# Patient Record
Sex: Male | Born: 1995 | Race: Black or African American | Hispanic: No | Marital: Single | Smoking: Current every day smoker
Health system: Southern US, Community
[De-identification: ages and names within clinical notes are randomized; demographics above are authoritative.]

## PROBLEM LIST (undated history)

## (undated) DIAGNOSIS — F209 Schizophrenia, unspecified: Secondary | ICD-10-CM

---
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
## 2015-12-12 ENCOUNTER — Emergency Department
Admission: EM | Admit: 2015-12-12 | Discharge: 2015-12-12 | Disposition: A | Discharge status: Home / Self Care | Payer: Self-pay | Attending: Emergency Medicine | Admitting: Emergency Medicine

## 2015-12-12 DIAGNOSIS — F17200 Nicotine dependence, unspecified, uncomplicated: Secondary | ICD-10-CM | POA: Insufficient documentation

## 2015-12-12 DIAGNOSIS — F1290 Cannabis use, unspecified, uncomplicated: Secondary | ICD-10-CM | POA: Insufficient documentation

## 2015-12-12 DIAGNOSIS — T7840XA Allergy, unspecified, initial encounter: Secondary | ICD-10-CM

## 2015-12-12 MED ORDER — DIPHENHYDRAMINE HCL 12.5 MG/5ML PO ELIX
50.0000 mg | ORAL_SOLUTION | Freq: Once | ORAL | Status: AC
  Administered 2015-12-12: 50 mg via ORAL
  Filled 2015-12-12: qty 20

## 2015-12-12 MED ORDER — FAMOTIDINE 20 MG PO TABS
40.0000 mg | ORAL_TABLET | Freq: Once | ORAL | Status: AC
  Administered 2015-12-12: 40 mg via ORAL
  Filled 2015-12-12: qty 2

## 2015-12-12 MED ORDER — PREDNISONE 20 MG PO TABS
ORAL_TABLET | ORAL | Status: AC
  Administered 2015-12-12: 20 mg via ORAL
  Filled 2015-12-12: qty 1

## 2015-12-12 MED ORDER — PREDNISONE 20 MG PO TABS
20.0000 mg | ORAL_TABLET | Freq: Every day | ORAL | Status: DC
  Administered 2015-12-12: 20 mg via ORAL

## 2015-12-12 NOTE — Discharge Instructions (Signed)
Allergies An allergy is an abnormal reaction to a substance by the body's defense system (immune system). Allergies can develop at any age. WHAT CAUSES ALLERGIES? An allergic reaction happens when the immune system mistakenly reacts to a normally harmless substance, called an allergen, as if it were harmful. The immune system releases antibodies to fight the substance. Antibodies eventually release a chemical called histamine into the bloodstream. The release of histamine is meant to protect the body from infection, but it also causes discomfort. An allergic reaction can be triggered by:  Eating an allergen.  Inhaling an allergen.  Touching an allergen. WHAT TYPES OF ALLERGIES ARE THERE? There are many types of allergies. Common types include:  Seasonal allergies. People with this type of allergy are usually allergic to substances that are only present during certain seasons, such as molds and pollens.  Food allergies.  Drug allergies.  Insect allergies.  Animal dander allergies. WHAT ARE SYMPTOMS OF ALLERGIES? Possible allergy symptoms include:  Swelling of the lips, face, tongue, mouth, or throat.  Sneezing, coughing, or wheezing.  Nasal congestion.  Tingling in the mouth.  Rash.  Itching.  Itchy, red, swollen areas of skin (hives).  Watery eyes.  Vomiting.  Diarrhea.  Dizziness.  Lightheadedness.  Fainting.  Trouble breathing or swallowing.  Chest tightness.  Rapid heartbeat. HOW ARE ALLERGIES DIAGNOSED? Allergies are diagnosed with a medical and family history and one or more of the following:  Skin tests.  Blood tests.  A food diary. A food diary is a record of all the foods and drinks you have in a day and of all the symptoms you experience.  The results of an elimination diet. An elimination diet involves eliminating foods from your diet and then adding them back in one by one to find out if a certain food causes an allergic reaction. HOW ARE  ALLERGIES TREATED? There is no cure for allergies, but allergic reactions can be treated with medicine. Severe reactions usually need to be treated at a hospital. HOW CAN REACTIONS BE PREVENTED? The best way to prevent an allergic reaction is by avoiding the substance you are allergic to. Allergy shots and medicines can also help prevent reactions in some cases. People with severe allergic reactions may be able to prevent a life-threatening reaction called anaphylaxis with a medicine given right after exposure to the allergen.   This information is not intended to replace advice given to you by your health care provider. Make sure you discuss any questions you have with your health care provider.   Document Released: 08/16/2002 Document Revised: 06/13/2014 Document Reviewed: 03/04/2014 Elsevier Interactive Patient Education [employer]2016 [employer]  Please take 2 of the over-the-counter Benadryl pills 4 times a day today and tomorrow. Since she had the first dose here in the ER take the Benadryl only 3 more times today. Should be about 6 hours apart for each dose. He takes Zantac once a day U had a dose of a similar medicine here in the emergency room so I will give you a prescription for one dose for tomorrow. Take prednisone once a day. I will give you dose of prednisone here in the emergency room so only take 1 dose again tomorrow. Please follow up with one of the doctors on the list I gave you [facility]. Return for worsening swelling return of the rash. Call 911 if you become short of breath at all. I would go ahead and wash all the bedclothes. CT can think what  might have caused the reaction.

## 2015-12-12 NOTE — ED Notes (Signed)
Pt presents to ED with c/o lower lip swelling and rash in the groin area. Pt states he woke up this morning around 4am and felt like his lower lip was swollen. Pt states he went to the [facility] to look at his lip when he also noticed an itch in the groin area. Pt denies any urinary c/o or s/x's. Pt denies any c/o sore throat, throat swelling or difficulty breathing. Pt denies any allergies to foods or medications. Pt denies pain or burning w/ the rash, just intense itching.

## 2015-12-12 NOTE — ED Provider Notes (Signed)
[facility] Emergency Department Provider Note   ____________________________________________  Time seen: Approximately 7:39 AM  I have reviewed the triage vital signs and the nursing notes.   HISTORY  Chief Complaint Rash and Oral Swelling    HPI Nicholas Barron is a 20 y.o. male who woke up this morning with a swollen lower lip and hives diffusely. Deep some alcohol on his arms is gone away lower lip swelling does not he was itchy but is not anymore. He has not used any new detergents or foods. Else. He is not sure what he could be allergic to as he has not had this problem before. He was not short of breath at all never had any wheezing.   History reviewed. No pertinent past medical history.  There are no active problems to display for this patient.   History reviewed. No pertinent past surgical history.  No current outpatient prescriptions on file.  Allergies Review of patient's allergies indicates no known allergies.  No family history on file.  Social History Social History  Substance Use Topics  . Smoking status: Current Every Day Smoker  . Smokeless tobacco: None  . Alcohol Use: No    Review of Systems Constitutional: No fever/chills Eyes: No visual changes. ENT: No sore throat. Cardiovascular: Denies chest pain. Respiratory: Denies shortness of breath. Gastrointestinal: No abdominal pain.  No nausea, no vomiting.  No diarrhea.  No constipation. Genitourinary: Negative for dysuria. Musculoskeletal: Negative for back pain. Skin: Negative for rash. Neurological: Negative for headaches, focal weakness or numbness. { 10-point ROS otherwise negative.  ____________________________________________   PHYSICAL EXAM:  VITAL SIGNS: ED Triage Vitals  Enc Vitals Group     BP 12/12/15 0625 128/74 mmHg     Pulse Rate 12/12/15 0625 73     Resp 12/12/15 0625 16     Temp 12/12/15 0627 97.7 F (36.5 C)     Temp Source 12/12/15 0627  Oral     SpO2 12/12/15 0625 99 %     Weight 12/12/15 0625 235 lb (106.595 kg)     Height 12/12/15 0625 5\' 7"  (1.702 m)     Head Cir --      Peak Flow --      Pain Score 12/12/15 0621 0     Pain Loc --      Pain Edu? --      Excl. in GC? --     Constitutional: Alert and oriented. Well appearing and in no acute distress. Eyes: Conjunctivae are normal. PERRL. EOMI. Head: Atraumatic.Lower lip is still somewhat swollen Nose: No congestion/rhinnorhea. Mouth/Throat: Mucous membranes are moist.  Oropharynx non-erythematous. Neck: No stridor.  Cardiovascular: Normal rate, regular rhythm. Grossly normal heart sounds.  Good peripheral circulation. Respiratory: Normal respiratory effort.  No retractions. Lungs CTAB. Gastrointestinal: Soft and nontender. No distention. No abdominal bruits. No CVA tenderness. Musculoskeletal: No lower extremity tenderness nor edema.  No joint effusions. Neurologic:  Normal speech and language. No gross focal neurologic deficits are appreciated. No gait instability. Skin:  Skin is warm, dry and intact. No rash noted. Psychiatric: Mood and affect are normal. Speech and behavior are normal.  ____________________________________________   LABS (all labs ordered are listed, but only abnormal results are displayed)  Labs Reviewed - No data to display ____________________________________________  EKG   ____________________________________________  RADIOLOGY      ____________________________________________   PROCEDURES    Procedures  She has no return of the rash she has no trouble with breathing still.  His swelling really hasn't changed much wider gone down just a little bit I will let him go with follow-up.  ____________________________________________   INITIAL IMPRESSION / ASSESSMENT AND PLAN / ED COURSE  Pertinent labs & imaging results that were available during my care of the patient were reviewed by me and considered in my medical  decision making (see chart for details).  ____________________________________________   FINAL CLINICAL IMPRESSION(S) / ED DIAGNOSES  Final diagnoses:  Allergic reaction, initial encounter      NEW MEDICATIONS STARTED DURING THIS VISIT:  There are no discharge medications for this patient.    Note:  This document was prepared using Dragon voice recognition software and may include unintentional dictation errors.    Arnaldo Natal, MD 12/12/15 [phone]

## 2015-12-12 NOTE — ED Notes (Signed)
Patient presents to the ED with c/o hives to his body, lower lip swelling, and genital swelling. Patient reports that he woke up this morning with the aforementioned symptoms. (+) hives observed in triage to the groin. Patient has no respiratory compromise noted; normal WOB noted; able to maintain a patent airway with no appreciable lingual edema; able to handle oral secretions. Denies new foods, soaps, medications, cosmetics, detergents, etc.

## 2016-06-03 ENCOUNTER — Emergency Department
Admission: EM | Admit: 2016-06-03 | Discharge: 2016-06-03 | Disposition: A | Discharge status: Home / Self Care | Payer: Self-pay | Attending: Emergency Medicine | Admitting: Emergency Medicine

## 2016-06-03 ENCOUNTER — Encounter: Payer: Self-pay | Admitting: Emergency Medicine

## 2016-06-03 ENCOUNTER — Emergency Department: Payer: Self-pay

## 2016-06-03 DIAGNOSIS — F17200 Nicotine dependence, unspecified, uncomplicated: Secondary | ICD-10-CM | POA: Insufficient documentation

## 2016-06-03 DIAGNOSIS — Y939 Activity, unspecified: Secondary | ICD-10-CM | POA: Insufficient documentation

## 2016-06-03 DIAGNOSIS — S6731XA Crushing injury of right wrist, initial encounter: Secondary | ICD-10-CM | POA: Insufficient documentation

## 2016-06-03 DIAGNOSIS — Y929 Unspecified place or not applicable: Secondary | ICD-10-CM | POA: Insufficient documentation

## 2016-06-03 DIAGNOSIS — Y999 Unspecified external cause status: Secondary | ICD-10-CM | POA: Insufficient documentation

## 2016-06-03 DIAGNOSIS — W231XXA Caught, crushed, jammed, or pinched between stationary objects, initial encounter: Secondary | ICD-10-CM | POA: Insufficient documentation

## 2016-06-03 MED ORDER — NAPROXEN 500 MG PO TABS: 500.0000 mg | ORAL_TABLET | Freq: Two times a day (BID) | ORAL | 0 refills | Status: DC

## 2016-06-03 NOTE — ED Provider Notes (Signed)
[facility] Emergency Department Provider Note ____________________________________________  Time seen: Approximately 6:06 PM  I have reviewed the triage vital signs and the nursing notes.   HISTORY  Chief Complaint Extremity Laceration and Wrist Pain    HPI Nicholas Barron is a 20 y.o. male who presents to the emergency department for evaluation of right wrist pain. He states that approximately 2 hours ago, he slammed his wrist and his car door. He has not taken anything for pain since the incident. He denies previous wrist fracture.  History reviewed. No pertinent past medical history.  There are no active problems to display for this patient.   History reviewed. No pertinent surgical history.  Prior to Admission medications   Medication Sig Start Date End Date Taking? Authorizing Provider  naproxen (NAPROSYN) 500 MG tablet Take 1 tablet (500 mg total) by mouth 2 (two) times daily with a meal. 06/03/16   Chinita Pesterari B Jameon Deller, FNP    Allergies Patient has no known allergies.  No family history on file.  Social History Social History  Substance Use Topics  . Smoking status: Current Every Day Smoker  . Smokeless tobacco: Never Used  . Alcohol use No    Review of Systems Constitutional: No recent illness. Cardiovascular: Denies chest pain or palpitations. Respiratory: Denies shortness of breath. Musculoskeletal: Pain in Right wrist Skin: Negative for rash, wound, lesion. Neurological: Negative for focal weakness or numbness.  ____________________________________________   PHYSICAL EXAM:  VITAL SIGNS: ED Triage Vitals  Enc Vitals Group     BP 06/03/16 1803 (!) 144/85     Pulse Rate 06/03/16 1803 86     Resp 06/03/16 1803 18     Temp 06/03/16 1803 98.2 F (36.8 C)     Temp Source 06/03/16 1803 Oral     SpO2 06/03/16 1803 100 %     Weight 06/03/16 1801 235 lb (106.6 kg)     Height 06/03/16 1804 5\' 8"  (1.727 m)     Head Circumference --       Peak Flow --      Pain Score 06/03/16 1755 8     Pain Loc --      Pain Edu? --      Excl. in GC? --     Constitutional: Alert and oriented. Well appearing and in no acute distress. Eyes: Conjunctivae are normal. EOMI. Head: Atraumatic. Neck: No stridor.  Respiratory: Normal respiratory effort.   Musculoskeletal: Right wrist tender to palpation along the joint line. No obvious deformity. Pain increases with flexion and extension as well as supination. Neurologic:  Normal speech and language. No gross focal neurologic deficits are appreciated. Speech is normal. No gait instability. Skin:  Skin is warm, dry and intact. Atraumatic. Psychiatric: Mood and affect are normal. Speech and behavior are normal.  ____________________________________________   LABS (all labs ordered are listed, but only abnormal results are displayed)  Labs Reviewed - No data to display ____________________________________________  RADIOLOGY  Right wrist is negative for acute bony abnormality per radiology. ____________________________________________   PROCEDURES  Procedure(s) performed: Velcro cock-up splint applied to the right wrist by the ER tech. Patient was neurovascularly intact post-application.   ____________________________________________   INITIAL IMPRESSION / ASSESSMENT AND PLAN / ED COURSE  Clinical Course     Pertinent labs & imaging results that were available during my care of the patient were reviewed by me and considered in my medical decision making (see chart for details).  20 year old male presenting to the emergency  department after his right hand was shut in his car door approximately 2 hours or to arrival. Exam is unremarkable and the x-ray does not reveal any acute abnormality. He was instructed to use the Velcro splint over the week, take the anti-inflammatory, and follow-up with the orthopedic specialist if not improving. He was advised to return to the emergency  department for symptoms that change or worsen if he is unable schedule an appointment. ____________________________________________   FINAL CLINICAL IMPRESSION(S) / ED DIAGNOSES  Final diagnoses:  Crush injury, wrist, right, initial encounter       Chinita PesterCari B Nettye Flegal, FNP 06/03/16 1928    Minna AntisKevin Paduchowski, MD 06/03/16 2226

## 2016-06-03 NOTE — ED Triage Notes (Signed)
States he jammed his right wrist in to door   Having pain to right wirst area with min swelling  Positive pulses noted

## 2016-06-03 NOTE — ED Notes (Signed)
Pt is in good condition; discharge instructions reviewed; follow up care and home care reviewed; prescription medication reviewed; pt verbalized understanding; pt is ambulatory and went home with family

## 2016-06-03 NOTE — Discharge Instructions (Signed)
Follow-up with orthopedics for symptoms that are not improving over the week. Return to the emergency department for symptoms that change or worsen if you are unable to schedule an appointment with the specialist or your primary care provider.

## 2016-06-06 ENCOUNTER — Emergency Department
Admission: EM | Admit: 2016-06-06 | Discharge: 2016-06-06 | Disposition: A | Discharge status: Home / Self Care | Payer: Medicaid Other | Attending: Emergency Medicine | Admitting: Emergency Medicine

## 2016-06-06 ENCOUNTER — Encounter: Payer: Self-pay | Admitting: Emergency Medicine

## 2016-06-06 DIAGNOSIS — T782XXA Anaphylactic shock, unspecified, initial encounter: Secondary | ICD-10-CM

## 2016-06-06 DIAGNOSIS — L509 Urticaria, unspecified: Secondary | ICD-10-CM | POA: Insufficient documentation

## 2016-06-06 DIAGNOSIS — F17200 Nicotine dependence, unspecified, uncomplicated: Secondary | ICD-10-CM | POA: Insufficient documentation

## 2016-06-06 DIAGNOSIS — Z791 Long term (current) use of non-steroidal anti-inflammatories (NSAID): Secondary | ICD-10-CM | POA: Insufficient documentation

## 2016-06-06 MED ORDER — FAMOTIDINE IN NACL 20-0.9 MG/50ML-% IV SOLN
20.0000 mg | Freq: Once | INTRAVENOUS | Status: AC
  Administered 2016-06-06: 20 mg via INTRAVENOUS
  Filled 2016-06-06: qty 50

## 2016-06-06 MED ORDER — EPINEPHRINE 0.3 MG/0.3ML IJ SOAJ: 0.3000 mg | Freq: Once | INTRAMUSCULAR | 0 refills | Status: AC

## 2016-06-06 MED ORDER — FAMOTIDINE 40 MG PO TABS: 40.0000 mg | ORAL_TABLET | Freq: Every evening | ORAL | 0 refills | Status: DC

## 2016-06-06 MED ORDER — DIPHENHYDRAMINE HCL 25 MG PO CAPS: 25.0000 mg | ORAL_CAPSULE | ORAL | 0 refills | Status: DC | PRN

## 2016-06-06 MED ORDER — PREDNISONE 20 MG PO TABS: 60.0000 mg | ORAL_TABLET | Freq: Every day | ORAL | 0 refills | Status: DC

## 2016-06-06 MED ORDER — DIPHENHYDRAMINE HCL 50 MG/ML IJ SOLN
50.0000 mg | Freq: Once | INTRAMUSCULAR | Status: AC
  Administered 2016-06-06: 50 mg via INTRAVENOUS
  Filled 2016-06-06: qty 1

## 2016-06-06 MED ORDER — EPINEPHRINE 0.3 MG/0.3ML IJ SOAJ
0.3000 mg | Freq: Once | INTRAMUSCULAR | Status: AC
  Administered 2016-06-06: 0.3 mg via INTRAMUSCULAR
  Filled 2016-06-06: qty 0.3

## 2016-06-06 MED ORDER — METHYLPREDNISOLONE SODIUM SUCC 125 MG IJ SOLR
125.0000 mg | Freq: Once | INTRAMUSCULAR | Status: AC
  Administered 2016-06-06: 125 mg via INTRAVENOUS
  Filled 2016-06-06: qty 2

## 2016-06-06 NOTE — ED Notes (Signed)
Pt with improved rash and hives noted. Pt states improved itching, resps unlabored.

## 2016-06-06 NOTE — ED Triage Notes (Signed)
Patient reports "breaking out" for the past 30 minutes.  Hives noted to arms bilaterally.  States has had this happen before, unknown allergen.

## 2016-06-06 NOTE — ED Notes (Signed)
Pt ambulatory with steady gait; c/o rash "coming and going for a while"; talking in complete sentences; no respiratory difficulty

## 2016-06-06 NOTE — ED Notes (Signed)
Pt with hives noted to bilateral forearms, abd, chest wall, back, legs. Pt states hives extend to his groin. Pt with hives noted around left lateral eyebrow. No angioedema noted. Pt with unlabored resps. Pt denies shob. No vomiting, nausea per pt. Pt states hives have improved after iv benadryl received in triage. Pt placed on cont pox and blood pressure cuff. Call bell at right side.

## 2016-06-06 NOTE — ED Notes (Signed)
Pt with improved swelling noted to left lateral eyebrow, hives to arms and legs, abd, groin unchanged.

## 2016-06-06 NOTE — ED Provider Notes (Signed)
[facility] Emergency Department Provider Note   ____________________________________________   First MD Initiated Contact with Patient 06/06/16 [phone]     (approximate)  I have reviewed the triage vital signs and the nursing notes.   HISTORY  Chief Complaint Urticaria    HPI Nicholas Barron is a 21 y.o. male who comes into the hospital today with hives. The patient reports it started around 2:30. He was watching TV when he just started feeling like his throat was swelling and he was itching with hives around his neck his arms and his back. The patient also had some swelling in his genital area. The patient had some shortness of breath but he did have chest tightness. He had spaghetti earlier today but had no new foods or drinks. The patient was given some medicine in triage and he reports that the shortness of breath is improved a little bit that he still feels a swelling in his throat. The patient still does have some hives but he came into the hospital for evaluation. He's never had anything like this happen in the past.    History reviewed. No pertinent past medical history.  There are no active problems to display for this patient.   History reviewed. No pertinent surgical history.  Prior to Admission medications   Medication Sig Start Date End Date Taking? Authorizing Provider  diphenhydrAMINE (BENADRYL) 25 mg capsule Take 1 capsule (25 mg total) by mouth every 4 (four) hours as needed. 06/06/16 06/06/17  Rebecka ApleyAllison P Webster, MD  EPINEPHrine 0.3 mg/0.3 mL IJ SOAJ injection Inject 0.3 mLs (0.3 mg total) into the muscle once. 06/06/16 06/06/16  Rebecka ApleyAllison P Webster, MD  famotidine (PEPCID) 40 MG tablet Take 1 tablet (40 mg total) by mouth every evening. 06/06/16 06/06/17  Rebecka ApleyAllison P Webster, MD  naproxen (NAPROSYN) 500 MG tablet Take 1 tablet (500 mg total) by mouth 2 (two) times daily with a meal. 06/03/16   Cari B Triplett, FNP  predniSONE (DELTASONE) 20 MG tablet Take 3  tablets (60 mg total) by mouth daily. 06/06/16   Rebecka ApleyAllison P Webster, MD    Allergies Patient has no known allergies.  No family history on file.  Social History Social History  Substance Use Topics  . Smoking status: Current Every Day Smoker  . Smokeless tobacco: Never Used  . Alcohol use No    Review of Systems Constitutional: No fever/chills Eyes: No visual changes. ENT: Throat swelling Cardiovascular: Denies chest pain. Respiratory:  shortness of breath. Gastrointestinal: No abdominal pain.  No nausea, no vomiting.  No diarrhea.  No constipation. Genitourinary: Negative for dysuria. Musculoskeletal: Negative for back pain. Skin: Hives Neurological: Negative for headaches, focal weakness or numbness.  10-point ROS otherwise negative.  ____________________________________________   PHYSICAL EXAM:  VITAL SIGNS: ED Triage Vitals [06/06/16 0252]  Enc Vitals Group     BP 133/73     Pulse Rate 91     Resp 18     Temp 97.6 F (36.4 C)     Temp Source Oral     SpO2 94 %     Weight      Height      Head Circumference      Peak Flow      Pain Score      Pain Loc      Pain Edu?      Excl. in GC?     Constitutional: Alert and oriented. Well appearing and in Mild distress. Eyes: Conjunctivae are normal.  PERRL. EOMI. Head: Atraumatic. Nose: No congestion/rhinnorhea. Mouth/Throat: Mucous membranes are moist.  Oropharynx non-erythematous. Cardiovascular: Normal rate, regular rhythm. Grossly normal heart sounds.  Good peripheral circulation. Respiratory: Normal respiratory effort.  No retractions. Lungs CTAB. Gastrointestinal: Soft and nontender. No distention. Positive bowel sounds Musculoskeletal: No lower extremity tenderness nor edema.   Neurologic:  Normal speech and language.  Skin:  Skin is warm, dry and intact. Hives around the patient's neck and on his bilateral arms no significant swelling to posterior oropharynx. Psychiatric: Mood and affect are normal.    ____________________________________________   LABS (all labs ordered are listed, but only abnormal results are displayed)  Labs Reviewed - No data to display ____________________________________________  EKG  none ____________________________________________  RADIOLOGY  none ____________________________________________   PROCEDURES  Procedure(s) performed: None  Procedures  Critical Care performed: No  ____________________________________________   INITIAL IMPRESSION / ASSESSMENT AND PLAN / ED COURSE  Pertinent labs & imaging results that were available during my care of the patient were reviewed by me and considered in my medical decision making (see chart for details).  This is a 21 year old male who comes into the hospital today with some hives, chest tightness and shortness of breath. The patient initially received some Solu-Medrol and Benadryl but I also gave him an epinephrine dose and Pepcid. After the epinephrine the patient's hives improved significantly and the chest tightness improved. I'm unsure what the patient is allergic to but I told him he does need to follow-up with the acute care clinic for further evaluation. The patient will go home with some prescriptions for prednisone Pepcid Benadryl and an EpiPen. The patient be discharged home.  Clinical Course      ____________________________________________   FINAL CLINICAL IMPRESSION(S) / ED DIAGNOSES  Final diagnoses:  Hives  Anaphylaxis, initial encounter      NEW MEDICATIONS STARTED DURING THIS VISIT:  New Prescriptions   DIPHENHYDRAMINE (BENADRYL) 25 MG CAPSULE    Take 1 capsule (25 mg total) by mouth every 4 (four) hours as needed.   EPINEPHRINE 0.3 MG/0.3 ML IJ SOAJ INJECTION    Inject 0.3 mLs (0.3 mg total) into the muscle once.   FAMOTIDINE (PEPCID) 40 MG TABLET    Take 1 tablet (40 mg total) by mouth every evening.   PREDNISONE (DELTASONE) 20 MG TABLET    Take 3 tablets (60 mg  total) by mouth daily.     Note:  This document was prepared using Dragon voice recognition software and may include unintentional dictation errors.    Rebecka Apley, MD 06/06/16 [phone]

## 2018-10-27 ENCOUNTER — Emergency Department: Payer: Self-pay

## 2018-10-27 ENCOUNTER — Other Ambulatory Visit: Payer: Self-pay

## 2018-10-27 ENCOUNTER — Emergency Department
Admission: EM | Admit: 2018-10-27 | Discharge: 2018-10-27 | Disposition: A | Discharge status: Home / Self Care | Payer: Self-pay | Attending: Emergency Medicine | Admitting: Emergency Medicine

## 2018-10-27 ENCOUNTER — Encounter: Payer: Self-pay | Admitting: Emergency Medicine

## 2018-10-27 DIAGNOSIS — F17210 Nicotine dependence, cigarettes, uncomplicated: Secondary | ICD-10-CM | POA: Insufficient documentation

## 2018-10-27 DIAGNOSIS — M79622 Pain in left upper arm: Secondary | ICD-10-CM | POA: Insufficient documentation

## 2018-10-27 DIAGNOSIS — M79602 Pain in left arm: Secondary | ICD-10-CM

## 2018-10-27 HISTORY — DX: Schizophrenia, unspecified: F20.9

## 2018-10-27 MED ORDER — NAPROXEN 500 MG PO TABS
500.0000 mg | ORAL_TABLET | Freq: Once | ORAL | Status: AC
  Administered 2018-10-27: 500 mg via ORAL
  Filled 2018-10-27: qty 1

## 2018-10-27 NOTE — Discharge Instructions (Signed)
Your exam and XR are normal at this time. Take OTC ibuprofen or naproxen for pain relief. Follow-up with one of the local community clinics for ongoing symptoms.

## 2018-10-27 NOTE — ED Triage Notes (Signed)
Pt to ED with c/o of left arm pain that he states has been "going on for a while". Pt not sure if it's been or week or longer. Pt can confirm or deny injury.

## 2018-10-29 NOTE — ED Provider Notes (Signed)
[facility] Emergency Department Provider Note ____________________________________________  Time seen: 1930  I have reviewed the triage vital signs and the nursing notes.  HISTORY  Chief Complaint  Arm Pain   HPI Nicholas Barron is a 23 y.o. male presents himself to the ED for evaluation of intermittent left arm pain. He localizes the discomfort to the deltoid and upper arm region. He denies any recent injury, trauma, paresthesias, weakness, or grip changes. His is a poor historian, in terms of offering any useful information on what the pain feels like, alleviating/aggrevating factors, or timing or onset.   Past Medical History:  Diagnosis Date  . Schizophrenia (HCC)     There are no active problems to display for this patient.   History reviewed. No pertinent surgical history.  Prior to Admission medications   Medication Sig Start Date End Date Taking? Authorizing Provider  diphenhydrAMINE (BENADRYL) 25 mg capsule Take 1 capsule (25 mg total) by mouth every 4 (four) hours as needed. 06/06/16 06/06/17  Rebecka Apley, MD  famotidine (PEPCID) 40 MG tablet Take 1 tablet (40 mg total) by mouth every evening. 06/06/16 06/06/17  Rebecka Apley, MD    Allergies Patient has no known allergies.  History reviewed. No pertinent family history.  Social History Social History   Tobacco Use  . Smoking status: Current Every Day Smoker  . Smokeless tobacco: Never Used  Substance Use Topics  . Alcohol use: No  . Drug use: Yes    Types: Marijuana    Review of Systems  Constitutional: Negative for fever. Eyes: Negative for visual changes. ENT: Negative for sore throat. Cardiovascular: Negative for chest pain. Respiratory: Negative for shortness of breath. Gastrointestinal: Negative for abdominal pain, vomiting and diarrhea. Genitourinary: Negative for dysuria. Musculoskeletal: Negative for back pain. Skin: Negative for rash. Neurological: Negative for  headaches, focal weakness or numbness. ____________________________________________  PHYSICAL EXAM:  VITAL SIGNS: ED Triage Vitals  Enc Vitals Group     BP 10/27/18 1857 (!) 162/92     Pulse Rate 10/27/18 1857 92     Resp 10/27/18 1857 18     Temp 10/27/18 1857 98.7 F (37.1 C)     Temp Source 10/27/18 1857 Oral     SpO2 10/27/18 1857 100 %     Weight 10/27/18 1857 200 lb (90.7 kg)     Height 10/27/18 1857 5\' 6"  (1.676 m)     Head Circumference --      Peak Flow --      Pain Score 10/27/18 1901 5     Pain Loc --      Pain Edu? --      Excl. in GC? --     Constitutional: Alert and oriented. Well appearing and in no distress. Head: Normocephalic and atraumatic. Eyes: Conjunctivae are normal. Normal extraocular movements Neck: Supple. No thyromegaly. Cardiovascular: Normal rate, regular rhythm. Normal distal pulses. Respiratory: Normal respiratory effort. No wheezes/rales/rhonchi. Musculoskeletal: Left shoulder without deformity, dislocation, or sulcus sign. Normal shoulder ROM without crepitus. Normal rotator cuff resistance testing. Normal grip strength.  Nontender with normal range of motion in all other extremities.  Neurologic: CN II-XII grossly intact. Normal intrinsic and opposition testing. Normal gait without ataxia. Normal speech and language. No gross focal neurologic deficits are appreciated. Skin:  Skin is warm, dry and intact. No rash noted. Psychiatric: Mood is anxious and affect is flat.  ____________________________________________  RADIOLOGY  Left Shoulder  Negative ____________________________________________ PROCEDURES  Procedures Naproxen 500 mg PO ____________________________________________  INITIAL IMPRESSION / ASSESSMENT AND PLAN / ED COURSE  [name] was evaluated in Emergency Department on 10/30/2018 for the symptoms described in the history of present illness. He was evaluated in the context of the global COVID-19 pandemic, which  necessitated consideration that the patient might be at risk for infection with the SARS-CoV-2 virus that causes COVID-19. Institutional protocols and algorithms that pertain to the evaluation of patients at risk for COVID-19 are in a state of rapid change based on information released by regulatory bodies including the CDC and federal and state organizations. These policies and algorithms were followed during the patient's care in the ED.  Patient with ED evaluation of intermittent left arm muscle pain. His exam is reassuring as he has not neuromuscular deficits, no indication of internal derangement of the shoulder, or mechanical dysfunction. He has a negative XR and benign exam. He is discharged to see his provider for ongoing symptoms. He should take OTC meds for pain and inflammation as needed. Return precautions are reviewed.  ____________________________________________  FINAL CLINICAL IMPRESSION(S) / ED DIAGNOSES  Final diagnoses:  Left arm pain      Helayna Dun, Charlesetta IvoryJenise V Bacon, PA-C 10/30/18 1220    Jene EveryKinner, Robert, MD 10/31/18 1743

## 2018-12-04 ENCOUNTER — Other Ambulatory Visit: Payer: Self-pay

## 2018-12-04 ENCOUNTER — Encounter: Payer: Self-pay | Admitting: Emergency Medicine

## 2018-12-04 ENCOUNTER — Emergency Department
Admission: EM | Admit: 2018-12-04 | Discharge: 2018-12-05 | Disposition: A | Discharge status: Other Acute Inpatient Hospital | Payer: Medicaid Other | Attending: Emergency Medicine | Admitting: Emergency Medicine

## 2018-12-04 DIAGNOSIS — F17200 Nicotine dependence, unspecified, uncomplicated: Secondary | ICD-10-CM | POA: Insufficient documentation

## 2018-12-04 DIAGNOSIS — F209 Schizophrenia, unspecified: Secondary | ICD-10-CM | POA: Insufficient documentation

## 2018-12-04 DIAGNOSIS — R4689 Other symptoms and signs involving appearance and behavior: Secondary | ICD-10-CM | POA: Insufficient documentation

## 2018-12-04 DIAGNOSIS — Z20828 Contact with and (suspected) exposure to other viral communicable diseases: Secondary | ICD-10-CM | POA: Insufficient documentation

## 2018-12-04 LAB — CBC
HCT: 44.9 % (ref 39.0–52.0)
Hemoglobin: 14.8 g/dL (ref 13.0–17.0)
MCH: 29.4 pg (ref 26.0–34.0)
MCHC: 33 g/dL (ref 30.0–36.0)
MCV: 89.3 fL (ref 80.0–100.0)
Platelets: 275 10*3/uL (ref 150–400)
RBC: 5.03 MIL/uL (ref 4.22–5.81)
RDW: 15 % (ref 11.5–15.5)
WBC: 11.8 10*3/uL — ABNORMAL HIGH (ref 4.0–10.5)
nRBC: 0 % (ref 0.0–0.2)

## 2018-12-04 LAB — URINE DRUG SCREEN, QUALITATIVE (ARMC ONLY)
Amphetamines, Ur Screen: NOT DETECTED
Barbiturates, Ur Screen: NOT DETECTED
Benzodiazepine, Ur Scrn: NOT DETECTED
Cannabinoid 50 Ng, Ur ~~LOC~~: POSITIVE — AB
Cocaine Metabolite,Ur ~~LOC~~: NOT DETECTED
MDMA (Ecstasy)Ur Screen: NOT DETECTED
Methadone Scn, Ur: NOT DETECTED
Opiate, Ur Screen: NOT DETECTED
Phencyclidine (PCP) Ur S: NOT DETECTED
Tricyclic, Ur Screen: NOT DETECTED

## 2018-12-04 LAB — COMPREHENSIVE METABOLIC PANEL
ALT: 11 U/L (ref 0–44)
AST: 18 U/L (ref 15–41)
Albumin: 4.1 g/dL (ref 3.5–5.0)
Alkaline Phosphatase: 40 U/L (ref 38–126)
Anion gap: 11 (ref 5–15)
BUN: 18 mg/dL (ref 6–20)
CO2: 22 mmol/L (ref 22–32)
Calcium: 8.5 mg/dL — ABNORMAL LOW (ref 8.9–10.3)
Chloride: 107 mmol/L (ref 98–111)
Creatinine, Ser: 0.96 mg/dL (ref 0.61–1.24)
GFR calc Af Amer: >60 mL/min (ref >60)
GFR calc non Af Amer: >60 mL/min (ref >60)
Glucose, Bld: 85 mg/dL (ref 70–99)
Potassium: 3.6 mmol/L (ref 3.5–5.1)
Sodium: 140 mmol/L (ref 135–145)
Total Bilirubin: 1 mg/dL (ref 0.3–1.2)
Total Protein: 6.8 g/dL (ref 6.5–8.1)

## 2018-12-04 LAB — SALICYLATE LEVEL: Salicylate Lvl: <7 mg/dL (ref 2.8–30.0)

## 2018-12-04 LAB — ACETAMINOPHEN LEVEL: Acetaminophen (Tylenol), Serum: <10 ug/mL — ABNORMAL LOW (ref 10–30)

## 2018-12-04 LAB — ETHANOL: Alcohol, Ethyl (B): <10 mg/dL (ref <10)

## 2018-12-04 MED ORDER — HALOPERIDOL LACTATE 5 MG/ML IJ SOLN
5.0000 mg | Freq: Once | INTRAMUSCULAR | Status: AC
  Administered 2018-12-04: 5 mg via INTRAMUSCULAR
  Filled 2018-12-04: qty 1

## 2018-12-04 MED ORDER — LORAZEPAM 2 MG/ML IJ SOLN
2.0000 mg | Freq: Once | INTRAMUSCULAR | Status: AC
  Administered 2018-12-04: 2 mg via INTRAMUSCULAR
  Filled 2018-12-04: qty 1

## 2018-12-04 MED ORDER — NICOTINE 14 MG/24HR TD PT24: 14.0000 mg | MEDICATED_PATCH | Freq: Once | TRANSDERMAL | Status: DC

## 2018-12-04 MED ORDER — DIPHENHYDRAMINE HCL 50 MG/ML IJ SOLN
50.0000 mg | Freq: Once | INTRAMUSCULAR | Status: AC
Start: 2018-12-04 — End: 2018-12-04
  Administered 2018-12-04: 23:00:00 50 mg via INTRAMUSCULAR
  Filled 2018-12-04: qty 1
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
## 2018-12-04 NOTE — ED Notes (Signed)
Pt dressed out. Pt belonging bag:   1 cell phone  2 bank cards 1 black boxers/briefs 1 pair black socks 1 pair white shoes 1 black hat 1 grey sweat pants 2 earrings silver colored 1 black shirt

## 2018-12-04 NOTE — ED Notes (Signed)
Pt given sandwich tray and drink. 
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
## 2018-12-04 NOTE — ED Notes (Signed)
IVC PENDING  TTS  CONSULT  FROM  [facility]

## 2018-12-04 NOTE — ED Notes (Signed)
Report to include Situation, Background, Assessment, and Recommendations received from [facility]. Patient alert and oriented, warm and dry, in no acute distress. Patient denies SI, HI, AVH and pain. Patient made aware of Q15 minute rounds and Engineer, drilling presence for their safety. Patient instructed to come to me with needs or concerns.
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
## 2018-12-04 NOTE — ED Notes (Signed)
Patient assigned to appropriate care area   Introduced self to pt  Patient oriented to unit/care area: Informed that, for their safety, care areas are designed for safety and visiting and phone hours explained to patient. Patient verbalizes understanding, and verbal contract for safety obtained  Environment secured  

## 2018-12-05 ENCOUNTER — Inpatient Hospital Stay
Admission: AD | Admit: 2018-12-05 | Discharge: 2018-12-10 | DRG: 885 | Disposition: A | Discharge status: Home / Self Care | Payer: No Typology Code available for payment source | Attending: Psychiatry | Admitting: Psychiatry

## 2018-12-05 DIAGNOSIS — F1210 Cannabis abuse, uncomplicated: Secondary | ICD-10-CM | POA: Diagnosis present

## 2018-12-05 DIAGNOSIS — Z79899 Other long term (current) drug therapy: Secondary | ICD-10-CM

## 2018-12-05 DIAGNOSIS — F250 Schizoaffective disorder, bipolar type: Secondary | ICD-10-CM | POA: Diagnosis not present

## 2018-12-05 DIAGNOSIS — F3160 Bipolar disorder, current episode mixed, unspecified: Secondary | ICD-10-CM | POA: Diagnosis present

## 2018-12-05 DIAGNOSIS — F419 Anxiety disorder, unspecified: Secondary | ICD-10-CM | POA: Diagnosis present

## 2018-12-05 DIAGNOSIS — F17200 Nicotine dependence, unspecified, uncomplicated: Secondary | ICD-10-CM | POA: Diagnosis present

## 2018-12-05 DIAGNOSIS — F203 Undifferentiated schizophrenia: Secondary | ICD-10-CM

## 2018-12-05 DIAGNOSIS — F259 Schizoaffective disorder, unspecified: Secondary | ICD-10-CM | POA: Diagnosis present

## 2018-12-05 DIAGNOSIS — Z9119 Patient's noncompliance with other medical treatment and regimen: Secondary | ICD-10-CM

## 2018-12-05 DIAGNOSIS — Z91199 Patient's noncompliance with other medical treatment and regimen due to unspecified reason: Secondary | ICD-10-CM

## 2018-12-05 LAB — SARS CORONAVIRUS 2 BY RT PCR (HOSPITAL ORDER, PERFORMED IN ~~LOC~~ HOSPITAL LAB): SARS Coronavirus 2: NEGATIVE

## 2018-12-05 MED ORDER — MAGNESIUM HYDROXIDE 400 MG/5ML PO SUSP: 30.0000 mL | Freq: Every day | ORAL | Status: DC | PRN

## 2018-12-05 MED ORDER — ACETAMINOPHEN 325 MG PO TABS
650.0000 mg | ORAL_TABLET | Freq: Four times a day (QID) | ORAL | Status: DC | PRN
  Administered 2018-12-08 (×2): 650 mg via ORAL
  Filled 2018-12-05 (×2): qty 2

## 2018-12-05 MED ORDER — TRAZODONE HCL 50 MG PO TABS
50.0000 mg | ORAL_TABLET | Freq: Every evening | ORAL | Status: DC | PRN
  Administered 2018-12-05 – 2018-12-09 (×5): 50 mg via ORAL
  Filled 2018-12-05 (×4): qty 1

## 2018-12-05 MED ORDER — ALUM & MAG HYDROXIDE-SIMETH 200-200-20 MG/5ML PO SUSP: 30.0000 mL | ORAL | Status: DC | PRN

## 2018-12-05 NOTE — BH Assessment (Addendum)
Patient is to be admitted to [facility] by Dr. Einar Grad.  Attending Physician will be Dr. Weber Cooks.   Patient has been assigned to room 305, by [place].   Intake Paper Work has been signed and placed on patient chart.   ER staff is aware of the admission:  Raynelle Highland, ER Secretary    Dr. Myrene Buddy, ER MD   Amy T., Patient's Nurse   Butch Penny, Patient Access.

## 2018-12-05 NOTE — [facility] Group Notes (Signed)
[place] Group Notes:  (Nursing/MHT/Case Management/Adjunct)  Date:  12/05/2018  Time:  8:45 PM  Type of Therapy:  Group Therapy  Participation Level:  Active  Participation Quality:  Appropriate  Affect:  Appropriate  Cognitive:  Alert  Insight:  Good  Engagement in Group:  Engaged  Modes of Intervention:  Support  Summary of Progress/Problems:  Nicholas Barron 12/05/2018, 8:45 PM

## 2018-12-05 NOTE — ED Notes (Signed)
Patient observed lying in bed with eyes closed  Even, unlabored respirations observed   NAD pt appears to be sleeping  I will continue to monitor along with every 15 minute visual observations and ongoing security camera monitoring    
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
## 2018-12-05 NOTE — ED Notes (Signed)
Pt given meal tray and a sprite.  
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
## 2018-12-05 NOTE — ED Notes (Signed)
BEHAVIORAL HEALTH ROUNDING Patient sleeping: No. Patient alert and oriented: yes Behavior appropriate: Yes.  ; If no, describe:  Nutrition and fluids offered: yes Toileting and hygiene offered: Yes  Sitter present: q15 minute observations and security camera monitoring Law enforcement present: Yes  ODS  

## 2018-12-05 NOTE — ED Notes (Signed)
Hourly rounding reveals patient sleeping in room. No complaints, stable, in no acute distress. Q15 minute rounds and monitoring via Security Cameras to continue. 

## 2018-12-05 NOTE — ED Notes (Signed)
Pt given graham crackers, peanut butter and a ginger ale. 

## 2018-12-05 NOTE — ED Notes (Signed)
ED BHU PLACEMENT JUSTIFICATION Is the patient under IVC or is there intent for IVC: Yes.   Is the patient medically cleared: Yes.   Is there vacancy in the ED BHU: Yes.   Is the population mix appropriate for patient: Yes.   Is the patient awaiting placement in inpatient or outpatient setting: Yes.   Has the patient had a psychiatric consult: Yes.   Survey of unit performed for contraband, proper placement and condition of furniture, tampering with fixtures in bathroom, shower, and each patient room: Yes.  ; Findings:  APPEARANCE/BEHAVIOR Calm and cooperative NEURO ASSESSMENT Orientation: oriented x3  Denies pain Hallucinations: No.None noted (Hallucinations)  Denies  Speech: Normal Gait: normal RESPIRATORY ASSESSMENT Even  Unlabored respirations  CARDIOVASCULAR ASSESSMENT Pulses equal   regular rate  Skin warm and dry   GASTROINTESTINAL ASSESSMENT no GI complaint EXTREMITIES Full ROM  PLAN OF CARE Provide calm/safe environment. Vital signs assessed twice daily. ED BHU Assessment once each 12-hour shift.  Assure the ED provider has rounded once each shift. Provide and encourage hygiene. Provide redirection as needed. Assess for escalating behavior; address immediately and inform ED provider.  Assess family dynamic and appropriateness for visitation as needed: Yes.  ; If necessary, describe findings:  Educate the patient/family about BHU procedures/visitation: Yes.  ; If necessary, describe findings:   

## 2018-12-05 NOTE — ED Notes (Signed)
BEHAVIORAL HEALTH ROUNDING Patient sleeping: Yes.   Patient alert and oriented: eyes closed  Appears asleep Behavior appropriate: Yes.  ; If no, describe:  Nutrition and fluids offered: Yes  Toileting and hygiene offered: sleeping Sitter present: q 15 minute observations and security camera monitoring Law enforcement present: yes  ODS 

## 2018-12-05 NOTE — ED Provider Notes (Signed)
-----------------------------------------   6:24 AM on 12/05/2018 -----------------------------------------   Blood pressure 104/66, pulse 75, temperature 98.8 F (37.1 C), temperature source Oral, resp. rate 18, height 1.702 m (5\' 7" ), weight 93 kg, SpO2 100 %.  The patient is calm and cooperative at this time.  There have been no acute events since the last update.  Awaiting disposition plan from Behavioral Medicine team.   Hinda Kehr, MD 12/05/18 [phone]

## 2018-12-05 NOTE — ED Notes (Signed)

## 2018-12-05 NOTE — Tx Team (Signed)
Initial Treatment Plan 12/05/2018 5:45 PM Nicholas Barron [id number]    PATIENT STRESSORS: Financial difficulties Marital or family conflict Substance abuse   PATIENT STRENGTHS: Ability for insight Average or above average intelligence Capable of independent living Communication skills Supportive family/friends   PATIENT IDENTIFIED PROBLEMS: Psychosis 12/05/2018  Job 12/05/2018  Anger Management 12/05/2018                 DISCHARGE CRITERIA:  Ability to meet basic life and health needs Improved stabilization in mood, thinking, and/or behavior  PRELIMINARY DISCHARGE PLAN: Outpatient therapy Return to previous living arrangement  PATIENT/FAMILY INVOLVEMENT: This treatment plan has been presented to and reviewed with the patient, Nicholas Barron, and/or family member,  .  The patient and family have been given the opportunity to ask questions and make suggestions.  Leodis Liverpool, RN 12/05/2018, 5:45 PM

## 2018-12-05 NOTE — Plan of Care (Signed)
  Problem: Education: Goal: Knowledge of [facility] Education information/materials will improve Note: Instructed patient on [employer] , unit programing and verbalize understanding

## 2018-12-06 DIAGNOSIS — F1210 Cannabis abuse, uncomplicated: Secondary | ICD-10-CM

## 2018-12-06 DIAGNOSIS — Z9119 Patient's noncompliance with other medical treatment and regimen: Secondary | ICD-10-CM

## 2018-12-06 DIAGNOSIS — Z91199 Patient's noncompliance with other medical treatment and regimen due to unspecified reason: Secondary | ICD-10-CM

## 2018-12-06 DIAGNOSIS — Z5181 Encounter for therapeutic drug level monitoring: Secondary | ICD-10-CM

## 2018-12-06 DIAGNOSIS — F250 Schizoaffective disorder, bipolar type: Secondary | ICD-10-CM

## 2018-12-06 MED ORDER — SERTRALINE HCL 100 MG PO TABS: 100.0000 mg | ORAL_TABLET | Freq: Every day | ORAL | Status: DC

## 2018-12-06 MED ORDER — BENZTROPINE MESYLATE 1 MG PO TABS
0.5000 mg | ORAL_TABLET | Freq: Every day | ORAL | Status: DC
  Administered 2018-12-06 – 2018-12-07 (×2): 0.5 mg via ORAL
  Filled 2018-12-06 (×2): qty 1

## 2018-12-06 MED ORDER — HALOPERIDOL 5 MG PO TABS
5.0000 mg | ORAL_TABLET | Freq: Every day | ORAL | Status: DC
  Administered 2018-12-06 – 2018-12-07 (×2): 5 mg via ORAL
  Filled 2018-12-06 (×2): qty 1
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
## 2018-12-06 NOTE — Progress Notes (Signed)
Recreation Therapy Notes   Date: 12/06/2018  Time: 9:30 am  Location: Craft room  Behavioral response: Appropriate   Intervention Topic: Problem Solving   Discussion/Intervention:  Group content on today was focused on problem solving. The group described what problem solving is. Patients expressed how problems affect them and how they deal with problems. Individuals identified healthy ways to deal with problems. Patients explained what normally happens to them when they do not deal with problems. The group expressed reoccurring problems for them. The group participated in the intervention "Ways to Solve problems" where patients were given a chance to explore different ways to solve problems.  Clinical Observations/Feedback:  Patient came to group and defined problem solving as resolving whatever the issue is. He stated that he normally does not ask for help when he has a problem; he tries to fix things himself. Participant explained that solving problems can keep people out of a hospital like this. Individual was social with peers and staff while participating in group.  Aniyla Harling LRT/CTRS         Yachet Mattson 12/06/2018 11:16 AM

## 2018-12-06 NOTE — Progress Notes (Signed)
D: Patient stated slept good last night .Stated appetite good and energy level   normal. Stated concentration  good . Stated on Depression scale 8 , hopeless and anxiety .( low 0-10 high) Denies suicidal  homicidal ideations  Patient  Responding , auditory hallucinations.  No pain concerns . Appropriate ADL'S. Interacting with peers and staff. Staff continue to redirect .  Unable to process  information given . Staff continue to redirect .  Patient able to  maintained frustrations  appropriately  No angry or hostile  feeling noted . No  safety concerns . Voice no concerns around nutrition  with unit programming . Instruction  Given on  Medication , verbalize understanding. Goal  For this shift " To figure out a plan to discharge " Patient  Taken off the Zoloft and placed on Haldol A: Encourage patient participation  With unit programing   R: Voice no other concerns. Staff continue to monitor
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
## 2018-12-06 NOTE — Plan of Care (Signed)
Unable to process  information given . Staff continue to redirect .  Patient able to  maintained frustrations  appropriately  No angry or hostile  feeling noted . No  safety concerns . Voice no concerns around nutrition   Problem: Education: Goal: Knowledge of [place] [facility] Education information/materials will improve Outcome: Not Progressing   Problem: Coping: Goal: Ability to verbalize frustrations and anger appropriately will improve Outcome: Progressing Goal: Ability to demonstrate self-control will improve Outcome: Progressing   Problem: Safety: Goal: Periods of time without injury will increase Outcome: Progressing   Problem: Coping: Goal: Will verbalize feelings Outcome: Not Progressing   Problem: Nutritional: Goal: Ability to achieve adequate nutritional intake will improve Outcome: Progressing   Problem: Safety: Goal: Ability to redirect hostility and anger into socially appropriate behaviors will improve Outcome: Progressing

## 2018-12-06 NOTE — BHH Suicide Risk Assessment (Signed)
[place] INPATIENT:  Family/Significant Other Suicide Prevention Education  Suicide Prevention Education:  Patient Refusal for Family/Significant Other Suicide Prevention Education: The patient Nicholas Barron has refused to provide written consent for family/significant other to be provided Family/Significant Other Suicide Prevention Education during admission and/or prior to discharge.  Physician notified.  SPE completed with pt, as pt refused to consent to family contact. SPI pamphlet provided to pt and pt was encouraged to share information with support network, ask questions, and talk about any concerns relating to SPE. Pt denies access to guns/firearms and verbalized understanding of information provided. Mobile Crisis information also provided to pt.   Rozann Lesches 12/06/2018, 9:56 AM

## 2018-12-06 NOTE — BHH Group Notes (Signed)
Balance In Life 12/06/2018 1PM  Type of Therapy/Topic:  Group Therapy:  Balance in Life  Participation Level:  Active  Description of Group:   This group will address the concept of balance and how it feels and looks when one is unbalanced. Patients will be encouraged to process areas in their lives that are out of balance and identify reasons for remaining unbalanced. Facilitators will guide patients in utilizing problem-solving interventions to address and correct the stressor making their life unbalanced. Understanding and applying boundaries will be explored and addressed for obtaining and maintaining a balanced life. Patients will be encouraged to explore ways to assertively make their unbalanced needs known to significant others in their lives, using other group members and facilitator for support and feedback.  Therapeutic Goals: 1. Patient will identify two or more emotions or situations they have that consume much of in their lives. 2. Patient will identify signs/triggers that life has become out of balance:  3. Patient will identify two ways to set boundaries in order to achieve balance in their lives:  4. Patient will demonstrate ability to communicate their needs through discussion and/or role plays  Summary of Patient Progress: Actively and appropriately engaged in the group. Patient identified establishing healthy boundaries with family members as a way to create balance in his life. Patient says he enjoys going to work and taking care of his family.  Therapeutic Modalities:   Cognitive Behavioral Therapy Solution-Focused Therapy Assertiveness Training  Onyekachi Gathright Lynelle Smoke, LCSW

## 2018-12-06 NOTE — Progress Notes (Signed)
Patient alert and oriented x 4, affect is flat he denies SI/HI/AVH but, appears responding to internal stimuli no distress noted, his thoughts are organized, he was receptive to nursing staff, complaint with medication 15 minutes safety checks maintained will  continue to monitor.

## 2018-12-06 NOTE — Progress Notes (Signed)
Recreation Therapy Notes  INPATIENT RECREATION THERAPY ASSESSMENT  Patient Details Name: Nicholas Barron MRN: [id number] DOB: [birth year] Today's Date: 12/06/2018       Information Obtained From: Patient  Able to Participate in Assessment/Interview: Yes  Patient Presentation: Responsive  Reason for Admission (Per Patient): Active Symptoms  Patient Stressors: Work  Radiographer, therapeutic:   Building control surveyor, Other (Comment), Avoidance(Walk away)  Leisure Interests (2+):  [employer] - Microbiologist of Recreation/Participation: Biomedical engineer of Community Resources:  Yes  Community Resources:  [employer]  Current Use:    If no, Barriers?:    Expressed Interest in [place] of Residence:  [place]  Patient Main Form of Transportation:    Patient Strengths:  Nice  Patient Identified Areas of Improvement:  Thinking more about my problems and not going off my emotions  Patient Goal for Hospitalization:  Getting my medication and going home  Current SI (including self-harm):  No  Current HI:  No  Current AVH: No  Staff Intervention Plan: Group Attendance, Collaborate with Interdisciplinary Treatment Team  Consent to Intern Participation: N/A  Gale Klar 12/06/2018, 11:40 AM
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
## 2018-12-07 MED ORDER — TRAZODONE HCL 50 MG PO TABS: 50.0000 mg | ORAL_TABLET | Freq: Every evening | ORAL | 0 refills | Status: DC | PRN

## 2018-12-07 MED ORDER — HALOPERIDOL 5 MG PO TABS: 5.0000 mg | ORAL_TABLET | Freq: Every day | ORAL | 0 refills | Status: DC

## 2018-12-07 MED ORDER — NICOTINE 21 MG/24HR TD PT24
21.0000 mg | MEDICATED_PATCH | Freq: Every day | TRANSDERMAL | Status: DC
  Administered 2018-12-07 – 2018-12-09 (×3): 21 mg via TRANSDERMAL
  Filled 2018-12-07 (×4): qty 1

## 2018-12-07 MED ORDER — BENZTROPINE MESYLATE 0.5 MG PO TABS: 0.5000 mg | ORAL_TABLET | Freq: Every day | ORAL | 0 refills | Status: DC

## 2018-12-07 NOTE — BHH Group Notes (Signed)
LCSW Group Therapy Note  12/07/2018 1:00 PM  Type of Therapy and Topic:  Group Therapy:  Feelings around Relapse and Recovery  Participation Level:  Active   Description of Group:    Patients in this group will discuss emotions they experience before and after a relapse. They will process how experiencing these feelings, or avoidance of experiencing them, relates to having a relapse. Facilitator will guide patients to explore emotions they have related to recovery. Patients will be encouraged to process which emotions are more powerful. They will be guided to discuss the emotional reaction significant others in their lives may have to their relapse or recovery. Patients will be assisted in exploring ways to respond to the emotions of others without this contributing to a relapse.  Therapeutic Goals: 1. Patient will identify two or more emotions that lead to a relapse for them 2. Patient will identify two emotions that result when they relapse 3. Patient will identify two emotions related to recovery 4. Patient will demonstrate ability to communicate their needs through discussion and/or role plays   Summary of Patient Progress: Patient was present in group. Patient was attentive and engaged in group discussions.  Patient discussed how "being around negative people, anxiety and bad habits".  Patient reports that peers and family have made his feel like "I have committed a murder" when he has experienced a relapse.  Patient reports that he separates himself from others when escalated and listens to music.    Therapeutic Modalities:   Cognitive Behavioral Therapy Solution-Focused Therapy Assertiveness Training Relapse Prevention Therapy   Assunta Curtis, MSW, LCSW 12/07/2018 1:49 PM

## 2018-12-07 NOTE — Progress Notes (Signed)
[facility] MD Progress Note  12/07/2018 3:30 PM Nicholas Barron  MRN:  [id number] Subjective: Patient seen chart reviewed.  Patient with a history of schizophrenia or schizoaffective disorder came into the hospital after becoming very agitated violent and aggressive at home.  Since being in the hospital has been started back on antipsychotic medicine.  On interview today the patient is neatly dressed and groomed.  Makes good eye contact.  Speech is slow and diminished in amount but articulate and able to answer questions appropriately.  Denies having any hallucinations.  Denies any homicidal or suicidal thought.  He is requesting discharge because he claims he has to "get back to work" which is not really consistent with what was described on admission.  No sign of any stiffness or side effects from any of his medicine. Principal Problem: Schizoaffective disorder ([place]) Diagnosis: Principal Problem:   Schizoaffective disorder ([place]) Active Problems:   Noncompliance   Cannabis abuse  Total Time spent with patient: 30 minutes  Past Psychiatric History: History of prior hospitalization not long ago.  Had been very depressed and suicidal at that time.  More recently seemed to a bit agitated and aggressive.  Past Medical History:  Past Medical History:  Diagnosis Date  . Schizophrenia ([place])    History reviewed. No pertinent surgical history. Family History: History reviewed. No pertinent family history. Family Psychiatric  History: See previous Social History:  Social History   Substance and Sexual Activity  Alcohol Use No     Social History   Substance and Sexual Activity  Drug Use Yes  . Types: Marijuana    Social History   Socioeconomic History  . Marital status: Single    Spouse name: Not on file  . Number of children: Not on file  . Years of education: Not on file  . Highest education level: Not on file  Occupational History  . Not on file  Social Needs  . Financial resource strain:  Not on file  . Food insecurity    Worry: Not on file    Inability: Not on file  . Transportation needs    Medical: Not on file    Non-medical: Not on file  Tobacco Use  . Smoking status: Current Every Day Smoker  . Smokeless tobacco: Never Used  Substance and Sexual Activity  . Alcohol use: No  . Drug use: Yes    Types: Marijuana  . Sexual activity: Yes  Lifestyle  . Physical activity    Days per week: Not on file    Minutes per session: Not on file  . Stress: Not on file  Relationships  . Social Herbalist on phone: Not on file    Gets together: Not on file    Attends religious service: Not on file    Active member of club or organization: Not on file    Attends meetings of clubs or organizations: Not on file    Relationship status: Not on file  Other Topics Concern  . Not on file  Social History Narrative  . Not on file   Additional Social History:                         Sleep: Good  Appetite:  Good  Current Medications: Current Facility-Administered Medications  Medication Dose Route Frequency Provider Last Rate Last Dose  . acetaminophen (TYLENOL) tablet 650 mg  650 mg Oral Q6H PRN Elvin So, MD      .  alum & mag hydroxide-simeth (MAALOX/MYLANTA) 200-200-20 MG/5ML suspension 30 mL  30 mL Oral Q4H PRN Ravi, Himabindu, MD      . benztropine (COGENTIN) tablet 0.5 mg  0.5 mg Oral QHS Clapacs, John T, MD   0.5 mg at 12/06/18 2117  . haloperidol (HALDOL) tablet 5 mg  5 mg Oral QHS Clapacs, Jackquline DenmarkJohn T, MD   5 mg at 12/06/18 2117  . magnesium hydroxide (MILK OF MAGNESIA) suspension 30 mL  30 mL Oral Daily PRN Ravi, Himabindu, MD      . nicotine (NICODERM CQ - dosed in mg/24 hours) patch 21 mg  21 mg Transdermal Daily Clapacs, John T, MD   21 mg at 12/07/18 1113  . traZODone (DESYREL) tablet 50 mg  50 mg Oral QHS PRN Catalina Gravelhomspon, Jacqueline, NP   50 mg at 12/06/18 2118    Lab Results: No results found for this or any previous visit (from the past 48  hour(s)).  Blood Alcohol level:  Lab Results  Component Value Date   ETH <10 12/04/2018    Metabolic Disorder Labs: No results found for: HGBA1C, MPG No results found for: PROLACTIN No results found for: CHOL, TRIG, HDL, CHOLHDL, VLDL, LDLCALC  Physical Findings: AIMS:  , ,  ,  ,    CIWA:    COWS:     Musculoskeletal: Strength & Muscle Tone: within normal limits Gait & Station: normal Patient leans: N/A  Psychiatric Specialty Exam: Physical Exam  Nursing note and vitals reviewed. Constitutional: He appears well-developed and well-nourished.  HENT:  Head: Normocephalic and atraumatic.  Eyes: Pupils are equal, round, and reactive to light. Conjunctivae are normal.  Neck: Normal range of motion.  Cardiovascular: Regular rhythm and normal heart sounds.  Respiratory: Effort normal.  GI: Soft.  Musculoskeletal: Normal range of motion.  Neurological: He is alert.  Skin: Skin is warm and dry.  Psychiatric: Judgment normal. His affect is blunt. His speech is delayed. He is slowed. Cognition and memory are impaired. He expresses no homicidal and no suicidal ideation.    Review of Systems  Constitutional: Negative.   HENT: Negative.   Eyes: Negative.   Respiratory: Negative.   Cardiovascular: Negative.   Gastrointestinal: Negative.   Musculoskeletal: Negative.   Skin: Negative.   Neurological: Negative.   Psychiatric/Behavioral: Negative for depression, hallucinations, memory loss, substance abuse and suicidal ideas. The patient is not nervous/anxious and does not have insomnia.     Blood pressure 125/67, pulse 61, temperature 98.7 F (37.1 C), temperature source Oral, resp. rate 17, height 5\' 7"  (1.702 m), weight 93.9 kg, SpO2 100 %.Body mass index is 32.42 kg/m.  General Appearance: Casual  Eye Contact:  Fair  Speech:  Clear and Coherent  Volume:  Normal  Mood:  Euthymic  Affect:  Congruent  Thought Process:  Goal Directed  Orientation:  Full (Time, Place, and  Person)  Thought Content:  Logical  Suicidal Thoughts:  No  Homicidal Thoughts:  No  Memory:  Immediate;   Fair Recent;   Fair Remote;   Fair  Judgement:  Fair  Insight:  Fair  Psychomotor Activity:  Decreased  Concentration:  Concentration: Fair  Recall:  Good  Fund of Knowledge:  Fair  Language:  Fair  Akathisia:  No  Handed:  Right  AIMS (if indicated):     Assets:  Desire for Improvement Housing Physical Health Resilience  ADL's:  Intact  Cognition:  WNL  Sleep:  Number of Hours: 8     Treatment  Plan Summary: Daily contact with patient to assess and evaluate symptoms and progress in treatment, Medication management and Plan Patient seen and chart reviewed.  Patient is taking a small to medium dose of haloperidol at night.  Seems to be having no side effects.  Has not shown any dangerous or aggressive behavior.  Given his recent severe and clear-cut illness I think we will make sure he is stable.  Discharge likely for Monday.  Mordecai RasmussenJohn Clapacs, MD 12/07/2018, 3:30 PM

## 2018-12-07 NOTE — Tx Team (Addendum)
Interdisciplinary Treatment and Diagnostic Plan Update  12/07/2018 Time of Session: 10:30AM Nicholas SaxDemontre Barron MRN: [id number]  Principal Diagnosis: Schizoaffective disorder [facility](HCC)  Secondary Diagnoses: Principal Problem:   Schizoaffective disorder (HCC) Active Problems:   Noncompliance   Cannabis abuse   Current Medications:  Current Facility-Administered Medications  Medication Dose Route Frequency Provider Last Rate Last Dose  . acetaminophen (TYLENOL) tablet 650 mg  650 mg Oral Q6H PRN Ravi, Himabindu, MD      . alum & mag hydroxide-simeth (MAALOX/MYLANTA) 200-200-20 MG/5ML suspension 30 mL  30 mL Oral Q4H PRN Ravi, Himabindu, MD      . benztropine (COGENTIN) tablet 0.5 mg  0.5 mg Oral QHS Barron, Nicholas T, MD   0.5 mg at 12/06/18 2117  . haloperidol (HALDOL) tablet 5 mg  5 mg Oral QHS Barron, Nicholas DenmarkJohn T, MD   5 mg at 12/06/18 2117  . magnesium hydroxide (MILK OF MAGNESIA) suspension 30 mL  30 mL Oral Daily PRN Ravi, Himabindu, MD      . nicotine (NICODERM CQ - dosed in mg/24 hours) patch 21 mg  21 mg Transdermal Daily Barron, Nicholas T, MD      . traZODone (DESYREL) tablet 50 mg  50 mg Oral QHS PRN Catalina Barron, Jacqueline, NP   50 mg at 12/06/18 2118   PTA Medications: Medications Prior to Admission  Medication Sig Dispense Refill Last Dose  . OLANZapine (ZYPREXA) 10 MG tablet Take 10 mg by mouth at bedtime.     . sertraline (ZOLOFT) 100 MG tablet Take 100 mg by mouth daily.     . traZODone (DESYREL) 50 MG tablet Take 50 mg by mouth at bedtime.        Patient Stressors: Financial difficulties Marital or family conflict Substance abuse  Patient Strengths: Ability for insight Average or above average intelligence Capable of independent living Communication skills Supportive family/friends  Treatment Modalities: Medication Management, Group therapy, Case management,  1 to 1 session with clinician, Psychoeducation, Recreational therapy.   Physician Treatment Plan for Primary  Diagnosis: Schizoaffective disorder (HCC) Long Term Goal(s): Improvement in symptoms so as ready for discharge Improvement in symptoms so as ready for discharge   Short Term Goals: Ability to verbalize feelings will improve Ability to disclose and discuss suicidal ideas Ability to demonstrate self-control will improve Compliance with prescribed medications will improve  Medication Management: Evaluate patient's response, side effects, and tolerance of medication regimen.  Therapeutic Interventions: 1 to 1 sessions, Unit Group sessions and Medication administration.  Evaluation of Outcomes: Progressing  Physician Treatment Plan for Secondary Diagnosis: Principal Problem:   Schizoaffective disorder (HCC) Active Problems:   Noncompliance   Cannabis abuse  Long Term Goal(s): Improvement in symptoms so as ready for discharge Improvement in symptoms so as ready for discharge   Short Term Goals: Ability to verbalize feelings will improve Ability to disclose and discuss suicidal ideas Ability to demonstrate self-control will improve Compliance with prescribed medications will improve     Medication Management: Evaluate patient's response, side effects, and tolerance of medication regimen.  Therapeutic Interventions: 1 to 1 sessions, Unit Group sessions and Medication administration.  Evaluation of Outcomes: Progressing   RN Treatment Plan for Primary Diagnosis: Schizoaffective disorder (HCC) Long Term Goal(s): Knowledge of disease and therapeutic regimen to maintain health will improve  Short Term Goals: Ability to verbalize frustration and anger appropriately will improve, Ability to demonstrate self-control, Ability to participate in decision making will improve, Ability to disclose and discuss suicidal ideas and Ability to identify and  develop effective coping behaviors will improve  Medication Management: RN will administer medications as ordered by provider, will assess and  evaluate patient's response and provide education to patient for prescribed medication. RN will report any adverse and/or side effects to prescribing provider.  Therapeutic Interventions: 1 on 1 counseling sessions, Psychoeducation, Medication administration, Evaluate responses to treatment, Monitor vital signs and CBGs as ordered, Perform/monitor CIWA, COWS, AIMS and Fall Risk screenings as ordered, Perform wound care treatments as ordered.  Evaluation of Outcomes: Progressing   LCSW Treatment Plan for Primary Diagnosis: Schizoaffective disorder ([place]) Long Term Goal(s): Safe transition to appropriate next level of care at discharge, Engage patient in therapeutic group addressing interpersonal concerns.  Short Term Goals: Engage patient in aftercare planning with referrals and resources, Increase social support, Increase ability to appropriately verbalize feelings, Increase emotional regulation and Facilitate acceptance of mental health diagnosis and concerns  Therapeutic Interventions: Assess for all discharge needs, 1 to 1 time with Social worker, Explore available resources and support systems, Assess for adequacy in community support network, Educate family and significant other(s) on suicide prevention, Complete Psychosocial Assessment, Interpersonal group therapy.  Evaluation of Outcomes: Progressing   Progress in Treatment: Attending groups: Yes. Participating in groups: Yes. Taking medication as prescribed: Yes. Toleration medication: Yes. Family/Significant other contact made: No, will contact:  pt declined SPE contact Patient understands diagnosis: Yes. Discussing patient identified problems/goals with staff: Yes. Medical problems stabilized or resolved: Yes. Denies suicidal/homicidal ideation: Yes. Issues/concerns per patient self-inventory: No. Other: none  New problem(s) identified: No, Describe:  none  New Short Term/Long Term Goal(s): medication management for mood  stabilization; elimination of SI thoughts; development of comprehensive mental wellness plan.  Patient Goals:  "improve on my medications"  Discharge Plan or Barriers: Patient reports plans to be discharged to his mothers home at [place] and declined to share where he would be staying.  Patient has been scheduled with [facility] for aftercare.  Reason for Continuation of Hospitalization: Aggression Delusions  Hallucinations Medication stabilization  Estimated Length of Stay: 1-7 days  Recreational Therapy: Patient Stressors: Work  Patient Goal: Patient will successfully identify 2 ways of making healthy decisions post d/c within 5 recreation therapy group sessions  Attendees: Patient: Nicholas Barron 12/07/2018 11:10 AM  Physician: Dr. Weber Cooks, MD 12/07/2018 11:10 AM  Nursing:  12/07/2018 11:10 AM  RN Care Manager: 12/07/2018 11:10 AM  Social Worker: Assunta Curtis, LCSW 12/07/2018 11:10 AM  Recreational Therapist: Roanna Epley, CTRS, LRT 12/07/2018 11:10 AM  Other: Sanjuana Kava, LCSW 12/07/2018 11:10 AM  Other:  12/07/2018 11:10 AM  Other: 12/07/2018 11:10 AM    Scribe for Treatment Team: Rozann Lesches, LCSW 12/07/2018 11:10 AM

## 2018-12-07 NOTE — Progress Notes (Signed)
Recreation Therapy Notes   Date: 12/07/2018  Time: 9:30 am  Location: Craft room  Behavioral response: Appropriate   Intervention Topic: Leisure   Discussion/Intervention:  Group content today was focused on leisure. The group defined what leisure is and some positive leisure activities they participate in. Individuals identified the difference between good and bad leisure. Participants expressed how they feel after participating in the leisure of their choice. The group discussed how they go about picking a leisure activity and if others are involved in their leisure activities. The patient stated how many leisure activities they too choose from and reasons why it is important to have leisure time. Individuals participated in the intervention "Exploration of Leisure" where they had a chance to identify new leisure activities as well as benefits of leisure. Clinical Observations/Feedback:  Patient came to group and stated that he likes to play chess during his free time. He explained that listening to rap music is a leisure activity he can enjoy any where.  Individual was social with peers and staff while participating in group.  Lalah Durango LRT/CTRS         Rocky Gladden 12/07/2018 11:16 AM

## 2018-12-07 NOTE — Plan of Care (Signed)
Patient newly admitted and hasn't had time to progress. Patient was compliant with medications and procedures on the unit.   Problem: Education: Goal: Knowledge of [place] [facility] Education information/materials will improve Outcome: Not Progressing   Problem: Coping: Goal: Ability to verbalize frustrations and anger appropriately will improve Outcome: Not Progressing Goal: Ability to demonstrate self-control will improve Outcome: Not Progressing   Problem: Safety: Goal: Periods of time without injury will increase Outcome: Not Progressing

## 2018-12-07 NOTE — Progress Notes (Signed)
D - Patient was in his room upon arrival to the unit. Patient was pleasant during assessment and medication administration. Patient denies SI/HI/AVH and pain with this Probation officer. Patient states, "I have some anxiety and depression just like everyone." Patient given education. Patient was observed by this Probation officer interacting appropriately with staff and peers on the unit. Patient was in the day room off and on all evening long.   A - Patient compliant with medication administration per MD orders and procedures on the unit. Patient given education. Patient given support and encouragement to be active in his treatment plan. Patient informed to let staff know if there are any issues or problems on the unit.   R - Patient being monitored Q 15 minutes for safety. Patient remains safe on the unit.

## 2018-12-07 NOTE — Plan of Care (Signed)
Patient is appropriate with staff & peers.Pleasant and cooperative on approach.Stated that he talked to his mother and he could go to his mother's house.Denies SI,HI and AVH.Appetite and energy level good.Attended groups.Support and encouragement given.

## 2018-12-08 MED ORDER — BENZTROPINE MESYLATE 1 MG PO TABS
1.0000 mg | ORAL_TABLET | Freq: Two times a day (BID) | ORAL | Status: DC
  Administered 2018-12-08 – 2018-12-10 (×4): 1 mg via ORAL
  Filled 2018-12-08 (×4): qty 1

## 2018-12-08 MED ORDER — HALOPERIDOL 5 MG PO TABS
10.0000 mg | ORAL_TABLET | Freq: Every day | ORAL | Status: DC
  Administered 2018-12-08 – 2018-12-09 (×2): 10 mg via ORAL
  Filled 2018-12-08 (×2): qty 2

## 2018-12-08 NOTE — Plan of Care (Signed)
Patient is appropriate with staff & peers.Patient was sad and tearful for being here.States "I am thinking too much of being here and have a headache now".Tylenol helped with headache.Denies SI,HI and AVH.Attended groups.Appetite and energy level good.Support and encouragement given.

## 2018-12-08 NOTE — Plan of Care (Signed)
  Problem: Coping: Goal: Ability to verbalize frustrations and anger appropriately will improve Outcome: Progressing  Patient expressed frustration about his discharge status, wanted to know exactly when he would be discharged.

## 2018-12-08 NOTE — Progress Notes (Signed)
Patient alert and oriented x 4, affect is flat but he brightens upon approach,  he denies SI/HI/AVH no distress noted, his thoughts are organized, he was receptive to nursing staff, interacting appropriately with peers and staff,  complaint with medication 15 minutes safety checks maintained will  continue to monitor.

## 2018-12-08 NOTE — [facility] Group Notes (Signed)
LCSW Group Therapy Note   12/08/2018 1:15pm   Type of Therapy and Topic:  Group Therapy:  Trust and Honesty  Participation Level:  Active  Description of Group:    In this group patients will be asked to explore the value of being honest.  Patients will be guided to discuss their thoughts, feelings, and behaviors related to honesty and trusting in others. Patients will process together how trust and honesty relate to forming relationships with peers, family members, and self. Each patient will be challenged to identify and express feelings of being vulnerable. Patients will discuss reasons why people are dishonest and identify alternative outcomes if one was truthful (to self or others). This group will be process-oriented, with patients participating in exploration of their own experiences, giving and receiving support, and processing challenge from other group members.   Therapeutic Goals: 1. Patient will identify why honesty is important to relationships and how honesty overall affects relationships.  2. Patient will identify a situation where they lied or were lied too and the  feelings, thought process, and behaviors surrounding the situation 3. Patient will identify the meaning of being vulnerable, how that feels, and how that correlates to being honest with self and others. 4. Patient will identify situations where they could have told the truth, but instead lied and explain reasons of dishonesty.   Summary of Patient Progress The patient was able to explore the value of being honest.  Patient discussed thoughts, feelings, and behaviors related to honesty and trusting in others. The patient processed together with other group members how trust and honesty relate to forming relationships with peers, family members, and self. Pt actively and appropriately engaged in the group. Patient was able to provide support and validation to other group members. Patient practiced active listening when  interacting with the facilitator and other group members.    Therapeutic Modalities:   Cognitive Behavioral Therapy Solution Focused Therapy Motivational Interviewing Brief Therapy  Adaleigh Warf  CUEBAS-COLON, LCSW 12/08/2018 12:38 PM

## 2018-12-08 NOTE — Progress Notes (Signed)
[facility] MD Progress Note  12/08/2018 8:52 AM Nicholas Barron  MRN:  [id number] Subjective:   Patient is generally pleasant and cooperative, stating he "lost his temper at home" and is probably not welcome back there but will stay at another residence that he seems to have already arranged.  He has no EPS or TD.  Acknowledges he went off his medication that was prescribed at old [place].  He understands the treatment plan is to transition him to long-acting injectable medication.  He denies auditory and visual hallucinations, he denies thoughts of harming himself or harming others, he has no EPS or tardive dyskinesia noted on exam.  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 Principal Problem: Schizoaffective disorder ([place]) Diagnosis: Principal Problem:   Schizoaffective disorder ([place]) Active Problems:   Noncompliance   Cannabis abuse  Total Time spent with patient: 20 minutes  Past Psychiatric History: As noted previous admission at old [place] diagnosis of schizoaffective type condition  Past Medical History:  Past Medical History:  Diagnosis Date  . Schizophrenia ([place])    History reviewed. No pertinent surgical history. Family History: History reviewed. No pertinent family history. Family Psychiatric  History: no new data Social History:  Social History   Substance and Sexual Activity  Alcohol Use No     Social History   Substance and Sexual Activity  Drug Use Yes  . Types: Marijuana    Social History   Socioeconomic History  . Marital status: Single    Spouse name: Not on file  . Number of children: Not on file  . Years of education: Not on file  . Highest education level: Not on file  Occupational History  . Not on file  Social Needs  . Financial resource strain: Not on file  . Food insecurity    Worry: Not on file   Inability: Not on file  . Transportation needs    Medical: Not on file    Non-medical: Not on file  Tobacco Use  . Smoking status: Current Every Day Smoker  . Smokeless tobacco: Never Used  Substance and Sexual Activity  . Alcohol use: No  . Drug use: Yes    Types: Marijuana  . Sexual activity: Yes  Lifestyle  . Physical activity    Days per week: Not on file    Minutes per session: Not on file  . Stress: Not on file  Relationships  . Social Herbalist on phone: Not on file    Gets together: Not on file    Attends religious service: Not on file    Active member of club or organization: Not on file    Attends meetings of clubs or organizations: Not on file    Relationship status: Not on file  Other Topics Concern  . Not on file  Social History Narrative  . Not on file   Additional Social History:                         Sleep: Good  Appetite:  Good  Current Medications: Current Facility-Administered Medications  Medication Dose Route Frequency Provider Last Rate Last Dose  . acetaminophen (TYLENOL) tablet 650 mg  650 mg Oral Q6H PRN Ravi, Himabindu, MD      . alum & mag hydroxide-simeth (MAALOX/MYLANTA) 200-200-20 MG/5ML suspension 30 mL  30 mL Oral Q4H  PRN Patrick Northavi, Himabindu, MD      . benztropine (COGENTIN) tablet 1 mg  1 mg Oral BID Malvin JohnsFarah, Merland Holness, MD      . haloperidol (HALDOL) tablet 10 mg  10 mg Oral QHS Malvin JohnsFarah, Glendon Dunwoody, MD      . magnesium hydroxide (MILK OF MAGNESIA) suspension 30 mL  30 mL Oral Daily PRN Ravi, Himabindu, MD      . nicotine (NICODERM CQ - dosed in mg/24 hours) patch 21 mg  21 mg Transdermal Daily Clapacs, Jackquline DenmarkJohn T, MD   21 mg at 12/07/18 1113  . traZODone (DESYREL) tablet 50 mg  50 mg Oral QHS PRN Catalina Gravelhomspon, Jacqueline, NP   50 mg at 12/07/18 2159    Lab Results: No results found for this or any previous visit (from the past 48 hour(s)).  Blood Alcohol level:  Lab Results  Component Value Date   ETH <10 12/04/2018    Metabolic  Disorder Labs: No results found for: HGBA1C, MPG No results found for: PROLACTIN No results found for: CHOL, TRIG, HDL, CHOLHDL, VLDL, LDLCALC  Physical Findings: AIMS:  , ,  ,  ,    CIWA:    COWS:     Musculoskeletal: Strength & Muscle Tone: within normal limits Gait & Station: normal Psychiatric Specialty Exam: Physical Exam  ROS  Blood pressure 114/75, pulse 69, temperature 97.9 F (36.6 C), temperature source Oral, resp. rate 17, height 5\' 7"  (1.702 m), weight 93.9 kg, SpO2 100 %.Body mass index is 32.42 kg/m.  General Appearance: Casual  Eye Contact:  Good  Speech:  Clear and Coherent  Volume:  Normal  Mood:  Euthymic  Affect:  Congruent  Thought Process:  Coherent and Descriptions of Associations: Tangential  Orientation:  Full (Time, Place, and Person)  Thought Content:  Logical  Suicidal Thoughts:  No  Homicidal Thoughts:  No  Memory:  Immediate;   Good  Judgement:  Good  Insight:  Good  Psychomotor Activity:  Normal  Concentration:  Concentration: Good  Recall:  Good  Fund of Knowledge:  Good  Language:  Fair  Akathisia:  Negative  Handed:  Right  AIMS (if indicated):     Assets:  Communication Skills Desire for Improvement Housing Physical Health Resilience  ADL's:  Intact  Cognition:  WNL  Sleep:  Number of Hours: 8.15     Treatment Plan Summary: Daily contact with patient to assess and evaluate symptoms and progress in treatment, Medication management and Plan Escalate Haldol to 10 mg at bedtime escalate benztropine continue to monitor no change in precautions, continue reality-based therapy continue med and illness education  Malvin JohnsFARAH,Tyresha Fede, MD 12/08/2018, 8:52 AM

## 2018-12-09 NOTE — [facility] Group Notes (Signed)
LCSW Group Therapy Note 12/09/2018 1:15pm  Type of Therapy and Topic: Group Therapy: Feelings Around Returning Home & Establishing a Supportive Framework and Supporting Oneself When Supports Not Available  Participation Level: Active  Description of Group:  Patients first processed thoughts and feelings about upcoming discharge. These included fears of upcoming changes, lack of change, new living environments, judgements and expectations from others and overall stigma of mental health issues. The group then discussed the definition of a supportive framework, what that looks and feels like, and how do to discern it from an unhealthy non-supportive network. The group identified different types of supports as well as what to do when your family/friends are less than helpful or unavailable  Therapeutic Goals  1. Patient will identify one healthy supportive network that they can use at discharge. 2. Patient will identify one factor of a supportive framework and how to tell it from an unhealthy network. 3. Patient able to identify one coping skill to use when they do not have positive supports from others. 4. Patient will demonstrate ability to communicate their needs through discussion and/or role plays.  Summary of Patient Progress:  The patient reported he feels "good." Pt engaged during group session. As patients processed their anxiety about discharge and described healthy supports patient shared he is ready to be discharge. He reports he does not have a support system.  Patients identified at least one self-care tool they were willing to use after discharge.   Therapeutic Modalities Cognitive Behavioral Therapy Motivational Interviewing   Sarajean Dessert  CUEBAS-COLON, LCSW 12/09/2018 12:45 PM

## 2018-12-09 NOTE — Plan of Care (Signed)
Patient is alert and oriented x 4, stable and responding appropriately, calm most of the but came out for breakfast and eat 100% and vital signs is normal, patient filled out his inventory  and expressed good night sleep , good appetite and normal energy level , denies any depression or anxiety , and denies SI/HI/AVH at this time , concentration is good , with adequate coping skills , like to stay in a quiet place to read a book.. patient states that he wants to be discharged from here as he expressed no physical problems , patient is encouraged to maintain safety of self and others no distress.   Problem: Education: Goal: Knowledge of [place] [facility] Education information/materials will improve 12/09/2018 1016 by Clemens Catholic, RN Outcome: Progressing 12/09/2018 0029 by Clemens Catholic, RN Outcome: Progressing   Problem: Coping: Goal: Ability to verbalize frustrations and anger appropriately will improve 12/09/2018 1016 by Clemens Catholic, RN Outcome: Progressing 12/09/2018 0029 by Clemens Catholic, RN Outcome: Progressing Goal: Ability to demonstrate self-control will improve 12/09/2018 1016 by Clemens Catholic, RN Outcome: Progressing 12/09/2018 0029 by Clemens Catholic, RN Outcome: Progressing   Problem: Safety: Goal: Periods of time without injury will increase 12/09/2018 1016 by Clemens Catholic, RN Outcome: Progressing 12/09/2018 0029 by Clemens Catholic, RN Outcome: Progressing   Problem: Coping: Goal: Will verbalize feelings 12/09/2018 1016 by Clemens Catholic, RN Outcome: Progressing 12/09/2018 0029 by Clemens Catholic, RN Outcome: Progressing   Problem: Nutritional: Goal: Ability to achieve adequate nutritional intake will improve 12/09/2018 1016 by Clemens Catholic, RN Outcome: Progressing 12/09/2018 0029 by Clemens Catholic, RN Outcome: Progressing   Problem: Safety: Goal: Ability to redirect hostility and anger into socially appropriate behaviors  will improve 12/09/2018 1016 by Clemens Catholic, RN Outcome: Progressing 12/09/2018 0029 by Clemens Catholic, RN Outcome: Progressing

## 2018-12-09 NOTE — Progress Notes (Signed)
[facility]  12/09/2018 9:20 AM Nicholas Barron  MRN:  [id number] Subjective:   Patient generally in his room much of the day he denies wanting to harm self he denies all current positive symptoms is focused on discharge hoping to be discharged tomorrow or early in the week.  Denies current auditory or visual hallucinations, no EPS or TD, no thoughts of harming self as mentioned, and no delusional content discerned. Principal Problem: Schizoaffective disorder ([facility]) Diagnosis: Principal Problem:   Schizoaffective disorder ([facility]) Active Problems:   Noncompliance   Cannabis abuse  Total Time spent with patient: 20 minutes  Past Medical History:  Past Medical History:  Diagnosis Date  . Schizophrenia ([facility])    History reviewed. No pertinent surgical history. Family History: History reviewed. No pertinent family history. Family Psychiatric  History: no new Social History:  Social History   Substance and Sexual Activity  Alcohol Use No     Social History   Substance and Sexual Activity  Drug Use Yes  . Types: Marijuana    Social History   Socioeconomic History  . Marital status: Single    Spouse name: Not on file  . Number of children: Not on file  . Years of education: Not on file  . Highest education level: Not on file  Occupational History  . Not on file  Social Needs  . Financial resource strain: Not on file  . Food insecurity    Worry: Not on file    Inability: Not on file  . Transportation needs    Medical: Not on file    Non-medical: Not on file  Tobacco Use  . Smoking status: Current Every Day Smoker  . Smokeless tobacco: Never Used  Substance and Sexual Activity  . Alcohol use: No  . Drug use: Yes    Types: Marijuana  . Sexual activity: Yes  Lifestyle  . Physical activity    Days per week: Not on file    Minutes per session: Not on file  . Stress: Not on file  Relationships  . Social Musicianconnections    Talks on phone: Not on file    Gets  together: Not on file    Attends religious service: Not on file    Active member of club or organization: Not on file    Attends meetings of clubs or organizations: Not on file    Relationship status: Not on file  Other Topics Concern  . Not on file  Social History Narrative  . Not on file   Additional Social History:                         Sleep: Good  Appetite:  Good  Current Medications: Current Facility-Administered Medications  Medication Dose Route Frequency Provider Last Rate Last Dose  . acetaminophen (TYLENOL) tablet 650 mg  650 mg Oral Q6H PRN Daleen Boavi, Himabindu, MD   650 mg at 12/08/18 1618  . alum & mag hydroxide-simeth (MAALOX/MYLANTA) 200-200-20 MG/5ML suspension 30 mL  30 mL Oral Q4H PRN Ravi, Himabindu, MD      . benztropine (COGENTIN) tablet 1 mg  1 mg Oral BID Malvin JohnsFarah, [place] Kirchner, MD   1 mg at 12/09/18 0818  . haloperidol (HALDOL) tablet 10 mg  10 mg Oral QHS Malvin JohnsFarah, Kortni Hasten, MD   10 mg at 12/08/18 2121  . magnesium hydroxide (MILK OF MAGNESIA) suspension 30 mL  30 mL Oral Daily PRN Patrick Northavi, Himabindu, MD      .  nicotine (NICODERM CQ - dosed in mg/24 hours) patch 21 mg  21 mg Transdermal Daily Clapacs, John T, MD   21 mg at 12/08/18 1050  . traZODone (DESYREL) tablet 50 mg  50 mg Oral QHS PRN Lamont Dowdy, NP   50 mg at 12/08/18 2121    Lab Results: No results found for this or any previous visit (from the past 48 hour(s)).  Blood Alcohol level:  Lab Results  Component Value Date   ETH <10 80/32/1224    Metabolic Disorder Labs: No results found for: HGBA1C, MPG No results found for: PROLACTIN No results found for: CHOL, TRIG, HDL, CHOLHDL, VLDL, LDLCALC  Physical Findings: AIMS:  , ,  ,  ,    CIWA:    COWS:     Musculoskeletal: Strength & Muscle Tone: within normal limits Gait & Station: normal Patient leans: N/A  Psychiatric Specialty Exam: Physical Exam  ROS  Blood pressure 109/66, pulse (!) 51, temperature 98 F (36.7 C), temperature  source Oral, resp. rate 18, height 5\' 7"  (1.702 m), weight 93.9 kg, SpO2 100 %.Body mass index is 32.42 kg/m.  General Appearance: Casual  Eye Contact:  Fair  Speech:  Normal Rate  Volume:  Normal  Mood:  Euthymic  Affect:  Congruent  Thought Process:  Goal Directed and Descriptions of Associations: Circumstantial  Orientation:  Full (Time, Place, and Person)  Thought Content:  Logical  Suicidal Thoughts:  No  Homicidal Thoughts:  No  Memory:  Immediate;   Fair  Judgement:  Fair  Insight:  Fair  Psychomotor Activity:  Normal  Concentration:  Concentration: Fair  Recall:  [employer] of Knowledge:  Fair  Language:  Fair  Akathisia:  Negative  Handed:  Right  AIMS (if indicated):     Assets:  Resilience Social Support  ADL's:  Intact  Cognition:  WNL  Sleep:  Number of Hours: 8.15     Treatment Plan Summary: Daily contact with patient to assess and evaluate symptoms and progress in treatment, Medication management and Plan Continue oral haloperidol, as discussed in original treatment plan for transition to long-acting injectable will be probably tomorrow and probable discharge shortly thereafter.  No change in precautions or cognitive and reality based therapies  Johnn Hai, MD 12/09/2018, 9:20 AM

## 2018-12-09 NOTE — Plan of Care (Signed)
Education and support is provide to patient, encouraged to communicate concerns and to socialize with peers to improve emotional state, patient denies any SI,HI,AVH at this time, patient complies with medication regimen without any side effects and communicate appropriately, appetite is good , affect and mood is subdued in a depressed state, patient is maintaining safety in the unit only requiring 15 minutes safety check no distress.   Problem: Education: Goal: Knowledge of [place] [facility] Education information/materials will improve Outcome: Progressing   Problem: Coping: Goal: Ability to verbalize frustrations and anger appropriately will improve Outcome: Progressing Goal: Ability to demonstrate self-control will improve Outcome: Progressing   Problem: Safety: Goal: Periods of time without injury will increase Outcome: Progressing   Problem: Coping: Goal: Will verbalize feelings Outcome: Progressing   Problem: Nutritional: Goal: Ability to achieve adequate nutritional intake will improve Outcome: Progressing   Problem: Safety: Goal: Ability to redirect hostility and anger into socially appropriate behaviors will improve Outcome: Progressing

## 2018-12-09 NOTE — Progress Notes (Signed)
Has been in the milieu and participated in group activities. Calm and cooperative and denying thoughts of self harm. Denying hallucinations. Compliant with treatment and expressing readiness for discharge. Mentioned that he is not planning to return to his mother's house, that he  will find his own place. Patient ate his meals and took medications. Had no concern throughout this shift.

## 2018-12-09 NOTE — Plan of Care (Signed)
Cooperative and compliant with treatment 

## 2018-12-10 MED ORDER — HALOPERIDOL 5 MG PO TABS: 5.0000 mg | ORAL_TABLET | Freq: Every day | ORAL | Status: DC

## 2018-12-10 MED ORDER — TRAZODONE HCL 50 MG PO TABS: 50.0000 mg | ORAL_TABLET | Freq: Every evening | ORAL | 1 refills | Status: DC | PRN

## 2018-12-10 MED ORDER — HALOPERIDOL 5 MG PO TABS: 5.0000 mg | ORAL_TABLET | Freq: Every day | ORAL | 0 refills | Status: DC

## 2018-12-10 MED ORDER — HALOPERIDOL DECANOATE 100 MG/ML IM SOLN
100.0000 mg | INTRAMUSCULAR | Status: DC
  Administered 2018-12-10: 100 mg via INTRAMUSCULAR
  Filled 2018-12-10: qty 1

## 2018-12-10 MED ORDER — HALOPERIDOL DECANOATE 100 MG/ML IM SOLN: 100.0000 mg | INTRAMUSCULAR | 1 refills | Status: DC

## 2018-12-10 MED ORDER — BENZTROPINE MESYLATE 0.5 MG PO TABS: 0.5000 mg | ORAL_TABLET | Freq: Every day | ORAL | 1 refills | Status: DC

## 2018-12-10 NOTE — Progress Notes (Signed)
IM Haldol administered in L) deltoid. Education provided to patient prior to administration.

## 2018-12-10 NOTE — Progress Notes (Signed)
Recreation Therapy Notes  INPATIENT RECREATION TR PLAN  Patient Details Name: Nicholas Barron MRN: [id number] DOB: Oct 22, 1995 Today's Date: 12/10/2018  Rec Therapy Plan Is patient appropriate for Therapeutic Recreation?: Yes Treatment times per week: At least 3 Estimated Length of Stay: 5-7 days TR Treatment/Interventions: Group participation (Comment)  Discharge Criteria Pt will be discharged from therapy if:: Discharged Treatment plan/goals/alternatives discussed and agreed upon by:: Patient/family  Discharge Summary Short term goals set: Patient will successfully identify 2 ways of making healthy decisions post d/c within 5 recreation therapy group sessions Short term goals met: Complete Progress toward goals comments: Groups attended Which groups?: Leisure education, Stress management, Other (Comment)(Problem Solving) Reason goals not met: N/A Therapeutic equipment acquired: N/A Reason patient discharged from therapy: Discharge from hospital Pt/family agrees with progress & goals achieved: Yes Date patient discharged from therapy: 12/10/18   Jani Ploeger 12/10/2018, 10:21 AM

## 2018-12-10 NOTE — Discharge Summary (Signed)
Physician Discharge Summary Note  Patient:  Nicholas Barron is an 23 y.o., male MRN:  [id number] DOB:  09/10/1995 Patient phone:  [phone] (home)  Patient address:   [address] [place] [address],  Total Time spent with patient: 1 hour  Date of Admission:  12/05/2018 Date of Discharge: December 10, 2018  Reason for Admission: Admitted to the hospital after being brought in for agitation and threatening behavior towards his family in the context of confusion and paranoia and noncompliance  Principal Problem: Schizoaffective disorder [facility](HCC) Discharge Diagnoses: Principal Problem:   Schizoaffective disorder (HCC) Active Problems:   Noncompliance   Cannabis abuse   Past Psychiatric History: Patient had just recently had a hospitalization in [place] with a presentation there of psychotic depressive symptoms with suicidality.  Had been placed on antidepressants and antipsychotics but had been noncompliant after discharge.  Past Medical History:  Past Medical History:  Diagnosis Date  . Schizophrenia (HCC)    History reviewed. No pertinent surgical history. Family History: History reviewed. No pertinent family history. Family Psychiatric  History: None reported Social History:  Social History   Substance and Sexual Activity  Alcohol Use No     Social History   Substance and Sexual Activity  Drug Use Yes  . Types: Marijuana    Social History   Socioeconomic History  . Marital status: Single    Spouse name: Not on file  . Number of children: Not on file  . Years of education: Not on file  . Highest education level: Not on file  Occupational History  . Not on file  Social Needs  . Financial resource strain: Not on file  . Food insecurity    Worry: Not on file    Inability: Not on file  . Transportation needs    Medical: Not on file    Non-medical: Not on file  Tobacco Use  . Smoking status: Current Every Day Smoker  . Smokeless tobacco: Never Used  Substance and  Sexual Activity  . Alcohol use: No  . Drug use: Yes    Types: Marijuana  . Sexual activity: Yes  Lifestyle  . Physical activity    Days per week: Not on file    Minutes per session: Not on file  . Stress: Not on file  Relationships  . Social Musicianconnections    Talks on phone: Not on file    Gets together: Not on file    Attends religious service: Not on file    Active member of club or organization: Not on file    Attends meetings of clubs or organizations: Not on file    Relationship status: Not on file  Other Topics Concern  . Not on file  Social History Narrative  . Not on file    Hospital Course: Admitted to the psychiatric hospital.  After speaking with him and evaluating the current situation it sounded like he was no longer having any depressive symptoms today fact was having a little bit of hyperactivity and grandiosity.  For this reason I discontinued the Zoloft.  Also because of both price and the desire to make sure he stays compliant with medicine to switch him to Haldol.  Patient has been on haloperidol which was titrated up to 10 mg at night and tolerated without any side effects.  Patient has not displayed any violent behavior in the hospital has denied any violent ideation has not been threatening aggressive or suicidal.  He was agreeable to getting a Haldol  decanoate shot which was administered today.  I explained to him that it takes a while for the shot to start working and so we will discharge him still on oral medicine but at a lower dose.  He will be discharged on 5 mg oral medicine.  Also on Cogentin in the evening and trazodone as needed.  Follow-up with local mental health with [facility] as previously arranged.  Physical Findings: AIMS:  , ,  ,  ,    CIWA:    COWS:     Musculoskeletal: Strength & Muscle Tone: within normal limits Gait & Station: normal Patient leans: N/A  Psychiatric Specialty Exam: Physical Exam  Nursing note and vitals  reviewed. Constitutional: He appears well-developed and well-nourished.  HENT:  Head: Normocephalic and atraumatic.  Eyes: Pupils are equal, round, and reactive to light. Conjunctivae are normal.  Neck: Normal range of motion.  Cardiovascular: Regular rhythm and normal heart sounds.  Respiratory: Effort normal.  GI: Soft.  Musculoskeletal: Normal range of motion.  Neurological: He is alert.  Skin: Skin is warm and dry.  Psychiatric: He has a normal mood and affect. His behavior is normal. Judgment and thought content normal.    Review of Systems  Constitutional: Negative.   HENT: Negative.   Eyes: Negative.   Respiratory: Negative.   Cardiovascular: Negative.   Gastrointestinal: Negative.   Musculoskeletal: Negative.   Skin: Negative.   Neurological: Negative.   Psychiatric/Behavioral: Negative.     Blood pressure 94/67, pulse (!) 57, temperature 97.9 F (36.6 C), temperature source Oral, resp. rate 18, height 5\' 7"  (1.702 m), weight 93.9 kg, SpO2 100 %.Body mass index is 32.42 kg/m.  General Appearance: Casual  Eye Contact:  Good  Speech:  Normal Rate  Volume:  Normal  Mood:  Euthymic  Affect:  Congruent  Thought Process:  Goal Directed  Orientation:  Full (Time, Place, and Person)  Thought Content:  Logical  Suicidal Thoughts:  No  Homicidal Thoughts:  No  Memory:  Immediate;   Fair Recent;   Fair Remote;   Fair  Judgement:  Fair  Insight:  Fair  Psychomotor Activity:  Normal  Concentration:  Concentration: Fair  Recall:  [employer] of Knowledge:  Fair  Language:  Fair  Akathisia:  No  Handed:  Right  AIMS (if indicated):     Assets:  Desire for Improvement Housing Physical Health Resilience  ADL's:  Intact  Cognition:  WNL  Sleep:  Number of Hours: 8.75     Have you used any form of tobacco in the last 30 days? (Cigarettes, Smokeless Tobacco, Cigars, and/or Pipes): No  Has this patient used any form of tobacco in the last 30 days? (Cigarettes,  Smokeless Tobacco, Cigars, and/or Pipes) Yes, No  Blood Alcohol level:  Lab Results  Component Value Date   ETH <10 96/29/5284    Metabolic Disorder Labs:  No results found for: HGBA1C, MPG No results found for: PROLACTIN No results found for: CHOL, TRIG, HDL, CHOLHDL, VLDL, LDLCALC  See Psychiatric Specialty Exam and Suicide Risk Assessment completed by Attending Physician prior to discharge.  Discharge destination:  Home  Is patient on multiple antipsychotic therapies at discharge:  No   Has Patient had three or more failed trials of antipsychotic monotherapy by history:  No  Recommended Plan for Multiple Antipsychotic Therapies: NA  Discharge Instructions    Diet - low sodium heart healthy   Complete by: As directed    Increase activity slowly  Complete by: As directed      Allergies as of 12/10/2018   No Known Allergies     Medication List    STOP taking these medications   OLANZapine 10 MG tablet Commonly known as: ZYPREXA   sertraline 100 MG tablet Commonly known as: ZOLOFT     TAKE these medications     Indication  benztropine 0.5 MG tablet Commonly known as: COGENTIN Take 1 tablet (0.5 mg total) by mouth at bedtime.  Indication: Extrapyramidal Reaction caused by Medications   haloperidol 5 MG tablet Commonly known as: HALDOL Take 1 tablet (5 mg total) by mouth at bedtime.  Indication: Psychosis   haloperidol decanoate 100 MG/ML injection Commonly known as: HALDOL DECANOATE Inject 1 mL (100 mg total) into the muscle every 30 (thirty) days.  Indication: Manic Phase of Manic-Depression, MIXED BIPOLAR AFFECTIVE DISORDER   traZODone 50 MG tablet Commonly known as: DESYREL Take 1 tablet (50 mg total) by mouth at bedtime as needed for sleep. What changed:   when to take this  reasons to take this  Indication: Trouble Sleeping      Follow-up Information    [facility] Follow up on 12/19/2018.   Why: Nurse will call the day before to see if you  prefer smartphone or computer video conference.  Nurse will call [phone].  Appointment is at 1pm 12/19/2018 at 1pm. Contact information: [address] [place] [id number] [phone]           Follow-up recommendations:  Activity:  Activity as tolerated Diet:  Regular diet Other:  Outpatient follow-up with local mental health  Comments: Patient counseled about the utility of long-acting injectables and had no complaint.  I did not start any new medicine as he appears to be stable now but it was impressed upon him that he needs to follow-up with outpatient psychiatric treatment or risk rehospitalization or return of dangerous behavior.  Signed: Mordecai RasmussenJohn , MD 12/10/2018, 11:55 AM

## 2018-12-10 NOTE — Progress Notes (Addendum)
  [facility] Adult Case Management Discharge Plan :  Will you be returning to the same living situation after discharge:  Yes,  pt states he will go to his friends house Nicholas Barron) [address] [place] Barron [place] At discharge, do you have transportation home?: Yes,  pt provided with taxi voucher Do you have the ability to pay for your medications: No.  Release of information consent forms completed and in the chart;  Patient's signature needed at discharge.  Patient to Follow up at: Follow-up Information    [facility] Follow up on 12/19/2018.   Why: Nurse will call the day before to see if you prefer smartphone or computer video conference.  Nurse will call [phone].  Appointment is at 1pm 12/19/2018 at 1pm. Contact information: [place] [address] [phone]           Next level of care provider has access to Nicholas Barron and Suicide Prevention discussed: Yes,  with pt; pt declined family contact  Have you used any form of tobacco in the last 30 days? (Cigarettes, Smokeless Tobacco, Cigars, and/or Pipes): No  Has patient been referred to the Quitline?: N/A patient is not a smoker  Patient has been referred for addiction treatment: N/A  Nicholas Rack, LCSW 12/10/2018, 9:54 AM

## 2018-12-10 NOTE — BHH Group Notes (Signed)
LCSW Group Therapy Note   12/10/2018 11:34 AM   Type of Therapy and Topic:  Group Therapy:  Overcoming Obstacles   Participation Level:  Minimal   Description of Group:    In this group patients will be encouraged to explore what they see as obstacles to their own wellness and recovery. They will be guided to discuss their thoughts, feelings, and behaviors related to these obstacles. The group will process together ways to cope with barriers, with attention given to specific choices patients can make. Each patient will be challenged to identify changes they are motivated to make in order to overcome their obstacles. This group will be process-oriented, with patients participating in exploration of their own experiences as well as giving and receiving support and challenge from other group members.   Therapeutic Goals: 1. Patient will identify personal and current obstacles as they relate to admission. 2. Patient will identify barriers that currently interfere with their wellness or overcoming obstacles.  3. Patient will identify feelings, thought process and behaviors related to these barriers. 4. Patient will identify two changes they are willing to make to overcome these obstacles:      Summary of Patient Progress Pt was present in group and was respectful to other group members.Pt reported that a current obstacle for him is finances and to overcome you just have to "go out and get it". Pt reported going to the sweepstakes as a coping skill for him.     Therapeutic Modalities:   Cognitive Behavioral Therapy Solution Focused Therapy Motivational Interviewing Relapse Prevention Therapy  Evalina Field, MSW, LCSW Clinical Social Work 12/10/2018 11:34 AM

## 2018-12-10 NOTE — Progress Notes (Signed)
Patient alert and oriented x 4. Ambulates unit with steady gait. Verbally denies SI/HI/AVH and pain. Patient discharged on above date and time. Verbalized understanding the discharge information provided to patient upon discharge. Patient departed unit with discharge paperwork, prescriptions, 7 day supply of medications and personal belongings. Picked up by [employer] to be transported to a friends house.

## 2018-12-10 NOTE — BHH Suicide Risk Assessment (Signed)
[facility] Discharge Suicide Risk Assessment   Principal Problem: Schizoaffective disorder [facility]) Discharge Diagnoses: Principal Problem:   Schizoaffective disorder ([place]) Active Problems:   Noncompliance   Cannabis abuse   Total Time spent with patient: 45 minutes  Musculoskeletal: Strength & Muscle Tone: within normal limits Gait & Station: normal Patient leans: N/A  Psychiatric Specialty Exam: Review of Systems  Constitutional: Negative.   HENT: Negative.   Eyes: Negative.   Respiratory: Negative.   Cardiovascular: Negative.   Gastrointestinal: Negative.   Musculoskeletal: Negative.   Skin: Negative.   Neurological: Negative.   Psychiatric/Behavioral: Negative.     Blood pressure 94/67, pulse (!) 57, temperature 97.9 F (36.6 C), temperature source Oral, resp. rate 18, height 5\' 7"  (1.702 m), weight 93.9 kg, SpO2 100 %.Body mass index is 32.42 kg/m.  General Appearance: Casual  Eye Contact::  Good  Speech:  Clear and [id number]  Volume:  Normal  Mood:  Euthymic  Affect:  Congruent  Thought Process:  Goal Directed  Orientation:  Full (Time, Place, and Person)  Thought Content:  Logical  Suicidal Thoughts:  No  Homicidal Thoughts:  No  Memory:  Immediate;   Fair Recent;   Fair Remote;   Fair  Judgement:  Fair  Insight:  Fair  Psychomotor Activity:  Normal  Concentration:  Fair  Recall:  [employer] of [place]  Language: Fair  Akathisia:  No  Handed:  Right  AIMS (if indicated):     Assets:  Desire for Improvement Housing Physical Health Resilience Social Support  Sleep:  Number of Hours: 8.75  Cognition: WNL  ADL's:  Intact   Mental Status Per Nursing Assessment::   On Admission:  NA  Demographic Factors:  Male and Adolescent or young adult  Loss Factors: Decrease in vocational status  Historical Factors: Impulsivity  Risk Reduction Factors:   Sense of responsibility to family, Living with another person, especially a relative, Positive  social support and Positive therapeutic relationship  Continued Clinical Symptoms:  Schizophrenia:   Paranoid or undifferentiated type  Cognitive Features That Contribute To Risk:  None    Suicide Risk:  Minimal: No identifiable suicidal ideation.  Patients presenting with no risk factors but with morbid ruminations; may be classified as minimal risk based on the severity of the depressive symptoms  Follow-up Information    [facility] Follow up on 12/19/2018.   Why: Nurse will call the day before to see if you prefer smartphone or computer video conference.  Nurse will call [phone].  Appointment is at 1pm 12/19/2018 at 1pm. Contact information: [place] [address] [phone]           Plan Of Care/Follow-up recommendations:  Activity:  Patient with a history of bipolar disorder or schizoaffective disorder now stable without any agitation or violence.  Denies any hallucinations.  Not showing any aggression.  Patient seen and interviewed today.  He is able to describe an appropriate plan for discharge. Diet:  Regular Other:  Follow-up with local mental health in [place] continue current medication.  Alethia Berthold, MD 12/10/2018, 11:46 AM

## 2018-12-10 NOTE — Progress Notes (Signed)
Recreation Therapy Notes    Date: 12/10/2018  Time: 9:30 am  Location: Craft room  Behavioral response: Appropriate   Intervention Topic: Stress  Discussion/Intervention:  Group content on today was focused on stress. The group defined stress and way to cope with stress. Participants expressed how they know when they are stresses out. Individuals described the different ways they have to cope with stress. The group stated reasons why it is important to cope with stress. Patient explained what good stress is and some examples. The group participated in the intervention "Stress Management Jeopardy". Individuals were separated into two group and answered questions related to stress.  Clinical Observations/Feedback:  Patient came to group late due to unknown reasons; and stated he has many ways to cope with stress. Individual was social with peers and staff while participating in group.  Rayssa Atha LRT/CTRS         Laurisa Sahakian 12/10/2018 10:18 AM

## 2018-12-10 NOTE — Plan of Care (Signed)
Cooperative,pleasant on approach, and medication compliant. Patient appears to be resting in bed quietly at this time. No issues to report on shift at this time.

## 2019-11-05 ENCOUNTER — Encounter: Payer: Self-pay | Admitting: Emergency Medicine

## 2019-11-05 ENCOUNTER — Emergency Department
Admission: EM | Admit: 2019-11-05 | Discharge: 2019-11-05 | Disposition: A | Discharge status: Home / Self Care | Payer: Medicaid Other | Attending: Emergency Medicine | Admitting: Emergency Medicine

## 2019-11-05 ENCOUNTER — Other Ambulatory Visit: Payer: Self-pay

## 2019-11-05 DIAGNOSIS — K529 Noninfective gastroenteritis and colitis, unspecified: Secondary | ICD-10-CM

## 2019-11-05 DIAGNOSIS — Z20822 Contact with and (suspected) exposure to covid-19: Secondary | ICD-10-CM | POA: Insufficient documentation

## 2019-11-05 DIAGNOSIS — F17200 Nicotine dependence, unspecified, uncomplicated: Secondary | ICD-10-CM | POA: Insufficient documentation

## 2019-11-05 DIAGNOSIS — E860 Dehydration: Secondary | ICD-10-CM | POA: Insufficient documentation

## 2019-11-05 DIAGNOSIS — R112 Nausea with vomiting, unspecified: Secondary | ICD-10-CM | POA: Insufficient documentation

## 2019-11-05 DIAGNOSIS — R197 Diarrhea, unspecified: Secondary | ICD-10-CM | POA: Insufficient documentation

## 2019-11-05 DIAGNOSIS — B349 Viral infection, unspecified: Secondary | ICD-10-CM

## 2019-11-05 LAB — CBC
HCT: 40.6 % (ref 39.0–52.0)
Hemoglobin: 13.5 g/dL (ref 13.0–17.0)
MCH: 28.8 pg (ref 26.0–34.0)
MCHC: 33.3 g/dL (ref 30.0–36.0)
MCV: 86.8 fL (ref 80.0–100.0)
Platelets: 274 10*3/uL (ref 150–400)
RBC: 4.68 MIL/uL (ref 4.22–5.81)
RDW: 14.6 % (ref 11.5–15.5)
WBC: 12.6 10*3/uL — ABNORMAL HIGH (ref 4.0–10.5)
nRBC: 0 % (ref 0.0–0.2)

## 2019-11-05 LAB — URINALYSIS, COMPLETE (UACMP) WITH MICROSCOPIC
Bacteria, UA: NONE SEEN
Bilirubin Urine: NEGATIVE
Glucose, UA: NEGATIVE mg/dL
Hgb urine dipstick: NEGATIVE
Ketones, ur: 20 mg/dL — AB
Leukocytes,Ua: NEGATIVE
Nitrite: NEGATIVE
Protein, ur: NEGATIVE mg/dL
Specific Gravity, Urine: 1.024 (ref 1.005–1.030)
pH: 5 (ref 5.0–8.0)

## 2019-11-05 LAB — COMPREHENSIVE METABOLIC PANEL
ALT: 10 U/L (ref 0–44)
AST: 17 U/L (ref 15–41)
Albumin: 4.1 g/dL (ref 3.5–5.0)
Alkaline Phosphatase: 43 U/L (ref 38–126)
Anion gap: 10 (ref 5–15)
BUN: 9 mg/dL (ref 6–20)
CO2: 24 mmol/L (ref 22–32)
Calcium: 8.8 mg/dL — ABNORMAL LOW (ref 8.9–10.3)
Chloride: 104 mmol/L (ref 98–111)
Creatinine, Ser: 1.07 mg/dL (ref 0.61–1.24)
GFR calc Af Amer: >60 mL/min (ref >60)
GFR calc non Af Amer: >60 mL/min (ref >60)
Glucose, Bld: 89 mg/dL (ref 70–99)
Potassium: 3.5 mmol/L (ref 3.5–5.1)
Sodium: 138 mmol/L (ref 135–145)
Total Bilirubin: 1.6 mg/dL — ABNORMAL HIGH (ref 0.3–1.2)
Total Protein: 7.1 g/dL (ref 6.5–8.1)

## 2019-11-05 LAB — LIPASE, BLOOD: Lipase: 23 U/L (ref 11–51)

## 2019-11-05 LAB — SARS CORONAVIRUS 2 BY RT PCR (HOSPITAL ORDER, PERFORMED IN ~~LOC~~ HOSPITAL LAB): SARS Coronavirus 2: NEGATIVE

## 2019-11-05 MED ORDER — SODIUM CHLORIDE 0.9 % IV BOLUS
1000.0000 mL | Freq: Once | INTRAVENOUS | Status: AC
  Administered 2019-11-05: 1000 mL via INTRAVENOUS

## 2019-11-05 MED ORDER — ONDANSETRON 4 MG PO TBDP
4.0000 mg | ORAL_TABLET | Freq: Three times a day (TID) | ORAL | 0 refills | Status: DC | PRN
Start: 2019-11-05 — End: 2020-04-01

## 2019-11-05 MED ORDER — KETOROLAC TROMETHAMINE 30 MG/ML IJ SOLN
15.0000 mg | Freq: Once | INTRAMUSCULAR | Status: AC
  Administered 2019-11-05: 15 mg via INTRAVENOUS
  Filled 2019-11-05: qty 1

## 2019-11-05 MED ORDER — ONDANSETRON HCL 4 MG/2ML IJ SOLN
4.0000 mg | Freq: Once | INTRAMUSCULAR | Status: AC
  Administered 2019-11-05: 4 mg via INTRAVENOUS
  Filled 2019-11-05: qty 2

## 2019-11-05 MED ORDER — LOPERAMIDE HCL 2 MG PO CAPS
4.0000 mg | ORAL_CAPSULE | Freq: Once | ORAL | Status: AC
  Administered 2019-11-05: 4 mg via ORAL
  Filled 2019-11-05: qty 2

## 2019-11-05 NOTE — ED Triage Notes (Signed)
Here for diarrhea and fever since Sunday night. Reports fever 100 something. Ambulatory. Having lower abdominal pain as well. No vomiting.

## 2019-11-05 NOTE — ED Notes (Signed)
Pt c/o generalized abd cramping since Sunday after he went to a party, states he has had 3 watery stools today. Denies nausea and vomiting or any other sx at this time.

## 2019-11-05 NOTE — ED Triage Notes (Signed)
First nurse note- fever and diarrhea . Ambulatory, NAD

## 2019-11-05 NOTE — ED Provider Notes (Signed)
[facility] Emergency Department Provider Note  Time seen: 2:54 PM  I have reviewed the triage vital signs and the nursing notes.   HISTORY  Chief Complaint Diarrhea and Fever   HPI Nicholas Barron is a 24 y.o. male with a past medical history of schizophrenia, presents to the emergency department for nausea vomiting diarrhea and cough.  According to the patient for the past 2 days he has been experiencing intermittent nausea vomiting diarrhea with mild cough.  States occasional mucus production with his cough.  States subjective fever but not measured a temperature.  Denies any abdominal pain.  Denies any chest pain.  Patient states he has had approximately 3 episodes of diarrhea today.   Past Medical History:  Diagnosis Date  . Schizophrenia [facility])     Patient Active Problem List   Diagnosis Date Noted  . Noncompliance 12/06/2018  . Cannabis abuse 12/06/2018  . Schizoaffective disorder (HCC) 12/05/2018    History reviewed. No pertinent surgical history.  Prior to Admission medications   Medication Sig Start Date End Date Taking? Authorizing Provider  benztropine (COGENTIN) 0.5 MG tablet Take 1 tablet (0.5 mg total) by mouth at bedtime. 12/10/18   Clapacs, Jackquline Denmark, MD  haloperidol (HALDOL) 5 MG tablet Take 1 tablet (5 mg total) by mouth at bedtime. 12/10/18   Clapacs, Jackquline Denmark, MD  haloperidol decanoate (HALDOL DECANOATE) 100 MG/ML injection Inject 1 mL (100 mg total) into the muscle every 30 (thirty) days. 12/10/18   Clapacs, Jackquline Denmark, MD  traZODone (DESYREL) 50 MG tablet Take 1 tablet (50 mg total) by mouth at bedtime as needed for sleep. 12/10/18   Clapacs, Jackquline Denmark, MD    No Known Allergies  History reviewed. No pertinent family history.  Social History Social History   Tobacco Use  . Smoking status: Current Every Day Smoker  . Smokeless tobacco: Never Used  Substance Use Topics  . Alcohol use: No  . Drug use: Yes    Types: Marijuana    Review of  Systems Constitutional: Negative for fever. Cardiovascular: Negative for chest pain. Respiratory: Negative for shortness of breath. Gastrointestinal: Negative for abdominal pain.  Positive for nausea vomiting diarrhea. Musculoskeletal: Negative for musculoskeletal complaints Neurological: Negative for headache All other ROS negative  ____________________________________________   PHYSICAL EXAM:  VITAL SIGNS: ED Triage Vitals  Enc Vitals Group     BP 11/05/19 1151 133/82     Pulse Rate 11/05/19 1151 (!) 105     Resp 11/05/19 1151 16     Temp 11/05/19 1151 99.8 F (37.7 C)     Temp Source 11/05/19 1151 Oral     SpO2 11/05/19 1151 98 %     Weight 11/05/19 1148 205 lb (93 kg)     Height 11/05/19 1148 5\' 8"  (1.727 m)     Head Circumference --      Peak Flow --      Pain Score 11/05/19 1147 5     Pain Loc --      Pain Edu? --      Excl. in GC? --    Constitutional: Alert and oriented. Well appearing and in no distress. Eyes: Normal exam ENT      Head: Normocephalic and atraumatic.      Mouth/Throat: Mucous membranes are moist. Cardiovascular: Normal rate, regular rhythm.  Respiratory: Normal respiratory effort without tachypnea nor retractions. Breath sounds are clear  Gastrointestinal: Soft and nontender. No distention.  Musculoskeletal: Nontender with normal range of motion  in all extremities.  Neurologic:  Normal speech and language. No gross focal neurologic deficits  Skin:  Skin is warm, dry and intact.    ____________________________________________   INITIAL IMPRESSION / ASSESSMENT AND PLAN / ED COURSE  Pertinent labs & imaging results that were available during my care of the patient were reviewed by me and considered in my medical decision making (see chart for details).   Patient presents emergency department for 2 days of nausea vomiting diarrhea low-grade subjective fever and cough.  Overall the patient appears well, benign abdominal exam, lab work largely  within normal limits besides a slight leukocytosis 12,600.  Mildly tachycardic, does have 20 ketones in his urinalysis.  Suggest likely dehydration.  We will place an IV, treat medication and IV hydrate.  Given the patient's symptoms we will also send a Covid swab.  Overall patient appears well reassuring physical exam.  Covid is negative.  Discussed likely viral illness and supportive care at home.  We will discharge with Zofran, patient may use loperamide if needed.  Also Tylenol/ibuprofen as needed.  Patient agreeable.  Nicholas Barron was evaluated in Emergency Department on 11/05/2019 for the symptoms described in the history of present illness. He was evaluated in the context of the global COVID-19 pandemic, which necessitated consideration that the patient might be at risk for infection with the SARS-CoV-2 virus that causes COVID-19. Institutional protocols and algorithms that pertain to the evaluation of patients at risk for COVID-19 are in a state of rapid change based on information released by regulatory bodies including the CDC and federal and state organizations. These policies and algorithms were followed during the patient's care in the ED.  ____________________________________________   FINAL CLINICAL IMPRESSION(S) / ED DIAGNOSES  Nausea vomiting diarrhea Dehydration   Harvest Dark, MD 11/05/19 [phone]

## 2019-12-12 IMAGING — CR LEFT SHOULDER - 2+ VIEW
3 series · 3 of 3 positions shown · non-contrast
Comparison: None.

CLINICAL DATA: LEFT arm pain.

EXAM:
LEFT SHOULDER - 2+ VIEW

[shoulder grashey]
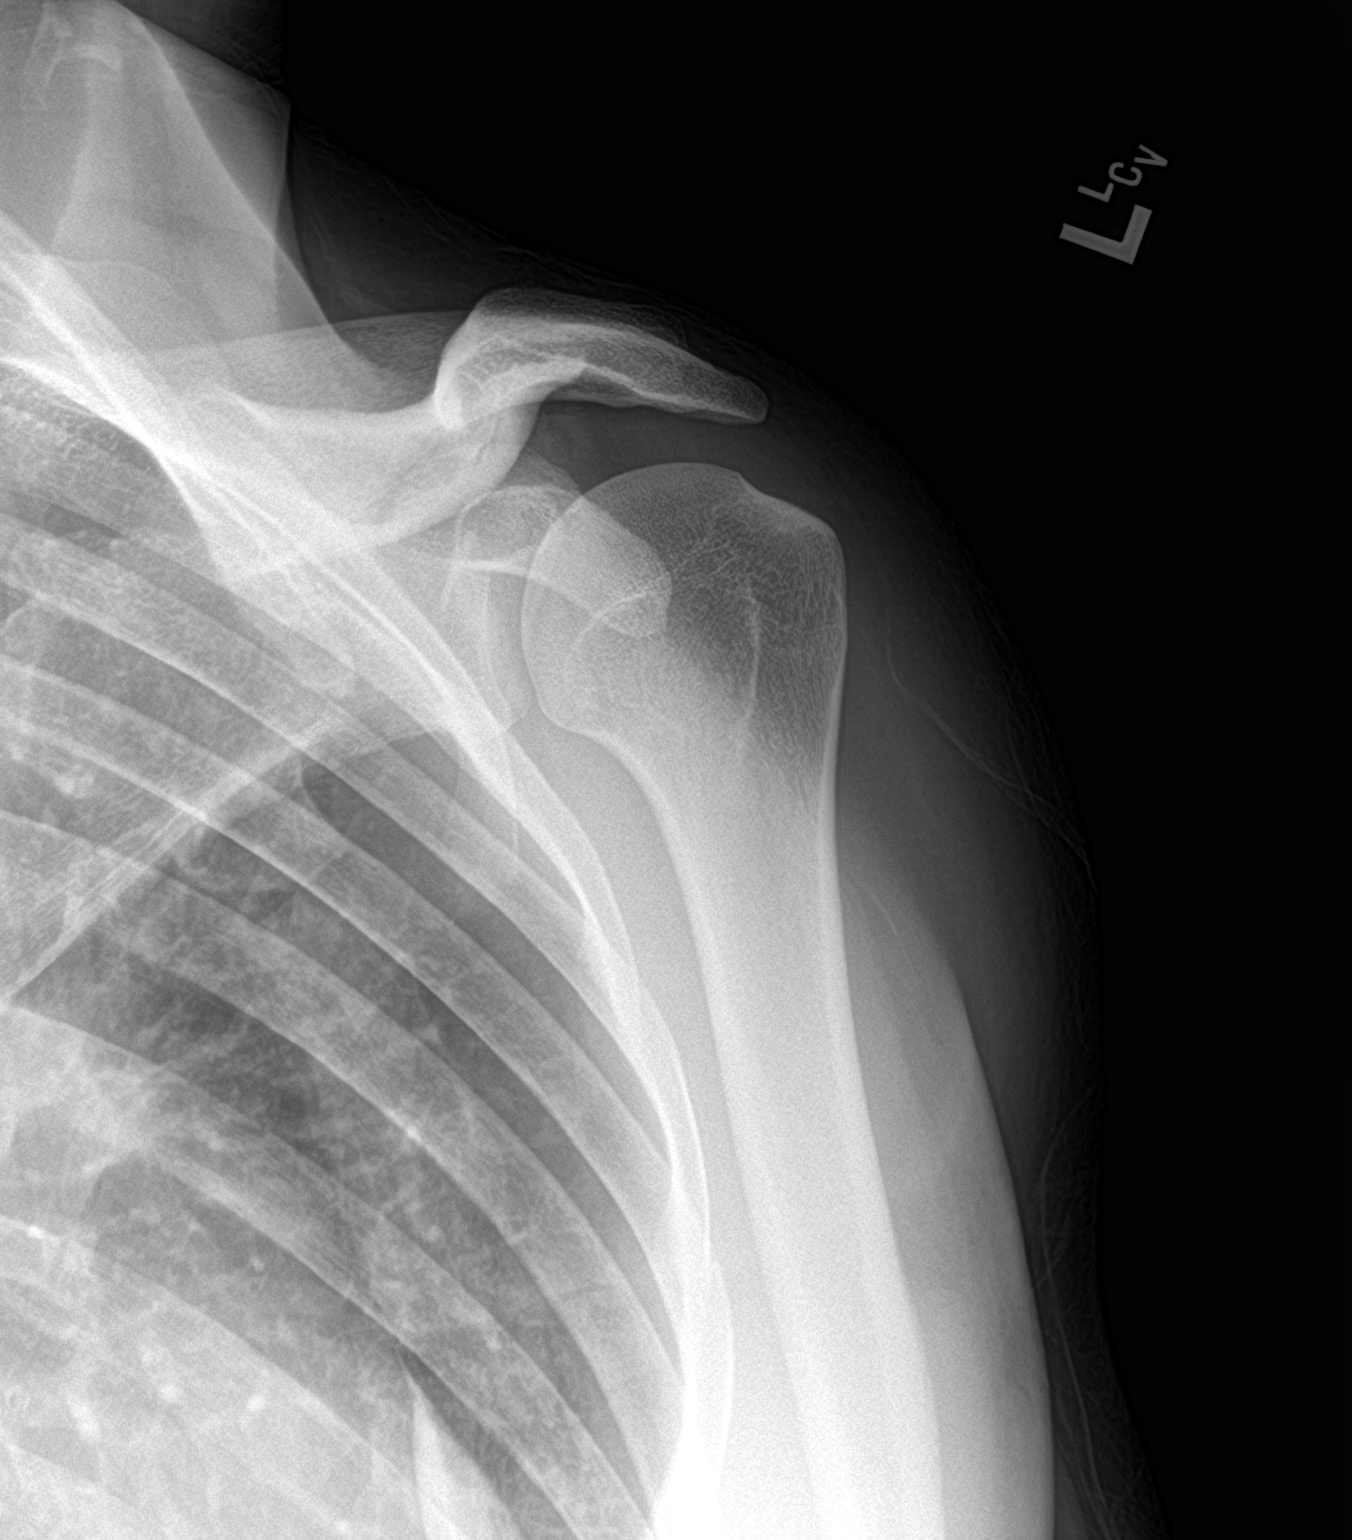

[shoulder y view]
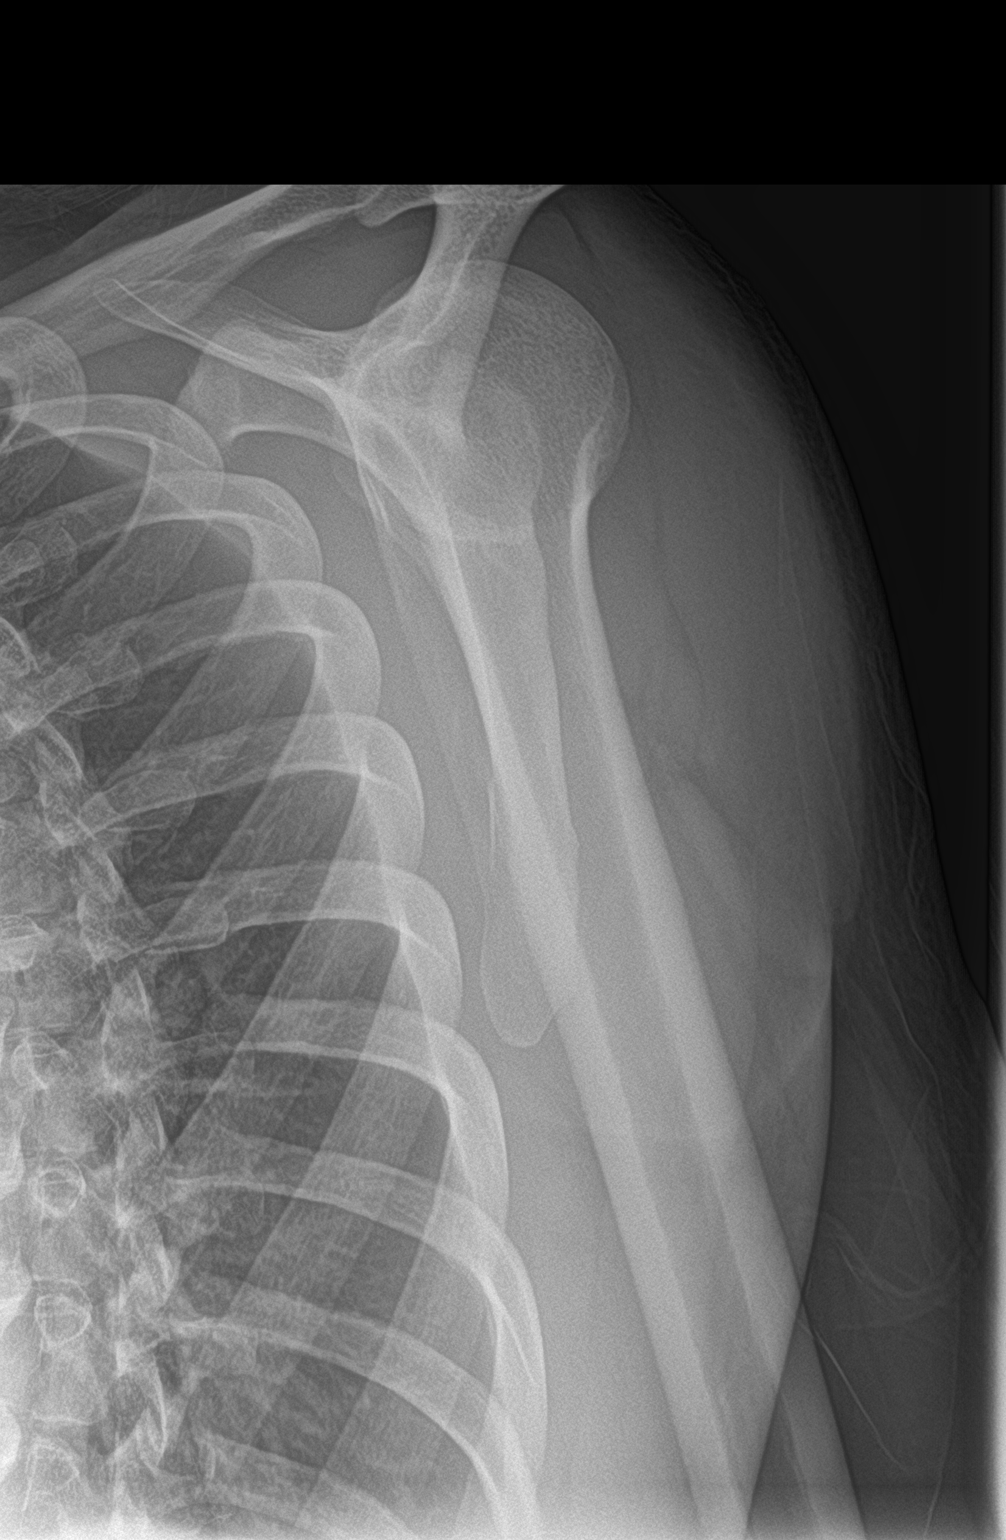

[shoulder axillary]
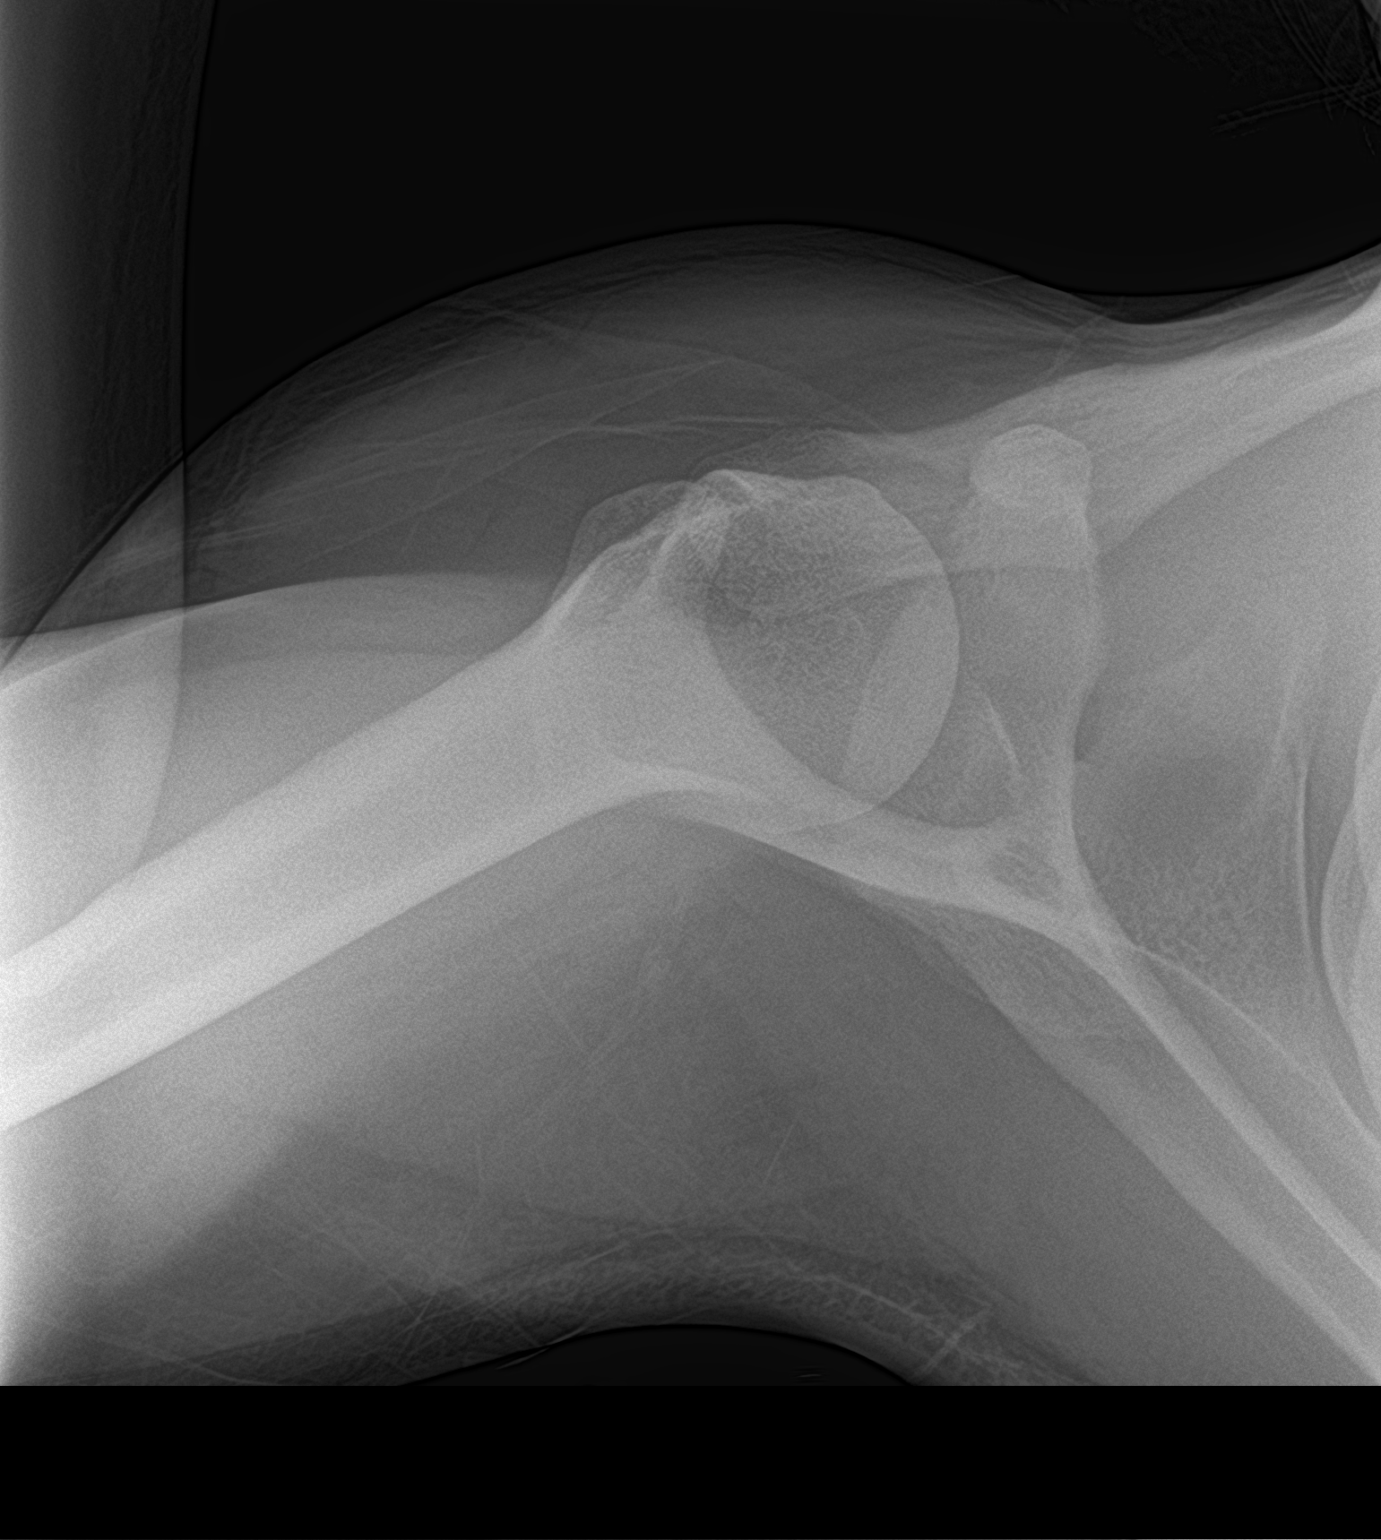

[3 of 3 positions shown; findings below may reference images not displayed]

FINDINGS: There is no evidence of fracture or dislocation. There is no
evidence of arthropathy or other focal bone abnormality. Soft
tissues are unremarkable.
IMPRESSION: Negative.

## 2019-12-18 ENCOUNTER — Emergency Department
Admission: EM | Admit: 2019-12-18 | Discharge: 2019-12-19 | Disposition: A | Discharge status: Other Acute Inpatient Hospital | Payer: Medicaid Other | Attending: Emergency Medicine | Admitting: Emergency Medicine

## 2019-12-18 ENCOUNTER — Other Ambulatory Visit: Payer: Self-pay

## 2019-12-18 DIAGNOSIS — F17210 Nicotine dependence, cigarettes, uncomplicated: Secondary | ICD-10-CM | POA: Insufficient documentation

## 2019-12-18 DIAGNOSIS — Z20822 Contact with and (suspected) exposure to covid-19: Secondary | ICD-10-CM | POA: Insufficient documentation

## 2019-12-18 DIAGNOSIS — F1210 Cannabis abuse, uncomplicated: Secondary | ICD-10-CM | POA: Diagnosis present

## 2019-12-18 DIAGNOSIS — F259 Schizoaffective disorder, unspecified: Secondary | ICD-10-CM | POA: Diagnosis present

## 2019-12-18 DIAGNOSIS — Z91199 Patient's noncompliance with other medical treatment and regimen due to unspecified reason: Secondary | ICD-10-CM

## 2019-12-18 DIAGNOSIS — Z008 Encounter for other general examination: Secondary | ICD-10-CM | POA: Insufficient documentation

## 2019-12-18 NOTE — ED Triage Notes (Signed)
Pt to the er for IVC with [place] [employer]. Pt states he went to his mother's house to protect her due to some trouble he had gotten into. Pt states he went to the kitchen to get a knife to protect his family. Pt is calm and cooperative in triage.  Per IVC papers taken out by mom, pt had left a homicidal note on 12/15/2019 and would not come back to get IVC till tonight. Mom locked herself in bathroom until police arrived.

## 2019-12-18 NOTE — ED Notes (Signed)
Pt. Is being dressed out by this tech and officers in the room. Pt was provided with hospital attire ( burgundy scrub top and bottom). Pt belongings are being placed inside bag that was provided.   Pt. Belongings are :   [employer] Socks  Black Pair of Crocs  [employer]  Green shorts  ID/Bank Card is inside the bag as well.

## 2019-12-19 LAB — CBC WITH DIFFERENTIAL/PLATELET
Abs Immature Granulocytes: 0.03 10*3/uL (ref 0.00–0.07)
Basophils Absolute: 0.1 10*3/uL (ref 0.0–0.1)
Basophils Relative: 1 %
Eosinophils Absolute: 0.2 10*3/uL (ref 0.0–0.5)
Eosinophils Relative: 2 %
HCT: 41.4 % (ref 39.0–52.0)
Hemoglobin: 14.3 g/dL (ref 13.0–17.0)
Immature Granulocytes: 0 %
Lymphocytes Relative: 39 %
Lymphs Abs: 4.9 10*3/uL — ABNORMAL HIGH (ref 0.7–4.0)
MCH: 28.6 pg (ref 26.0–34.0)
MCHC: 34.5 g/dL (ref 30.0–36.0)
MCV: 82.8 fL (ref 80.0–100.0)
Monocytes Absolute: 1.1 10*3/uL — ABNORMAL HIGH (ref 0.1–1.0)
Monocytes Relative: 8 %
Neutro Abs: 6.3 10*3/uL (ref 1.7–7.7)
Neutrophils Relative %: 50 %
Platelets: 359 10*3/uL (ref 150–400)
RBC: 5 MIL/uL (ref 4.22–5.81)
RDW: 14.1 % (ref 11.5–15.5)
WBC: 12.6 10*3/uL — ABNORMAL HIGH (ref 4.0–10.5)
nRBC: 0 % (ref 0.0–0.2)

## 2019-12-19 LAB — URINE DRUG SCREEN, QUALITATIVE (ARMC ONLY)
Amphetamines, Ur Screen: NOT DETECTED
Barbiturates, Ur Screen: NOT DETECTED
Benzodiazepine, Ur Scrn: NOT DETECTED
Cannabinoid 50 Ng, Ur ~~LOC~~: POSITIVE — AB
Cocaine Metabolite,Ur ~~LOC~~: NOT DETECTED
MDMA (Ecstasy)Ur Screen: NOT DETECTED
Methadone Scn, Ur: NOT DETECTED
Opiate, Ur Screen: NOT DETECTED
Phencyclidine (PCP) Ur S: NOT DETECTED
Tricyclic, Ur Screen: NOT DETECTED

## 2019-12-19 LAB — COMPREHENSIVE METABOLIC PANEL
ALT: 9 U/L (ref 0–44)
AST: 22 U/L (ref 15–41)
Albumin: 4 g/dL (ref 3.5–5.0)
Alkaline Phosphatase: 36 U/L — ABNORMAL LOW (ref 38–126)
Anion gap: 11 (ref 5–15)
BUN: 11 mg/dL (ref 6–20)
CO2: 24 mmol/L (ref 22–32)
Calcium: 8.7 mg/dL — ABNORMAL LOW (ref 8.9–10.3)
Chloride: 104 mmol/L (ref 98–111)
Creatinine, Ser: 1.32 mg/dL — ABNORMAL HIGH (ref 0.61–1.24)
GFR calc Af Amer: >60 mL/min (ref >60)
GFR calc non Af Amer: >60 mL/min (ref >60)
Glucose, Bld: 114 mg/dL — ABNORMAL HIGH (ref 70–99)
Potassium: 3.2 mmol/L — ABNORMAL LOW (ref 3.5–5.1)
Sodium: 139 mmol/L (ref 135–145)
Total Bilirubin: 0.7 mg/dL (ref 0.3–1.2)
Total Protein: 6.8 g/dL (ref 6.5–8.1)

## 2019-12-19 LAB — SARS CORONAVIRUS 2 BY RT PCR (HOSPITAL ORDER, PERFORMED IN ~~LOC~~ HOSPITAL LAB): SARS Coronavirus 2: NEGATIVE

## 2019-12-19 LAB — ETHANOL: Alcohol, Ethyl (B): <10 mg/dL (ref <10)

## 2019-12-19 LAB — ACETAMINOPHEN LEVEL: Acetaminophen (Tylenol), Serum: <10 ug/mL — ABNORMAL LOW (ref 10–30)

## 2019-12-19 LAB — SALICYLATE LEVEL: Salicylate Lvl: <7 mg/dL — ABNORMAL LOW (ref 7.0–30.0)

## 2019-12-19 MED ORDER — POTASSIUM CHLORIDE CRYS ER 20 MEQ PO TBCR
40.0000 meq | EXTENDED_RELEASE_TABLET | Freq: Once | ORAL | Status: AC
  Administered 2019-12-19: 40 meq via ORAL
  Filled 2019-12-19: qty 2

## 2019-12-19 NOTE — ED Notes (Addendum)
Pt discharged under IVC to [facility].  VS stable.  Belongings given to officer. Pt cooperative, but unhappy to learn he was being admitted.

## 2019-12-19 NOTE — ED Provider Notes (Signed)
[facility] Emergency Department Provider Note  ____________________________________________   First MD Initiated Contact with Patient 12/19/19 0029     (approximate)  I have reviewed the triage vital signs and the nursing notes.   HISTORY  Chief Complaint Behavior Problem    HPI Nicholas Barron is a 24 y.o. male with schizophrenia who comes in under IVC with [place] police.  Patient went to his mother's house to protect her due to some trouble he had gotten into.  IVC paperwork was taken out by mom.  Patient had left a homicidal note on 12/15/2019.  And mom reportedly thought that he was going to harm her so she left herself in the bathroom until police got there.  Patient states that it was just a misunderstanding that he should have warned her that he was just trying to protect her given he got an argument with some other people.  He denies any SI, HI, hallucinations.  Denies any other medical concerns  Concern for HI, occurred one time today, unclear what brought it on, nothing made it better however pt is denying saying it was misunderstanding.             Past Medical History:  Diagnosis Date  . Schizophrenia [facility])     Patient Active Problem List   Diagnosis Date Noted  . Noncompliance 12/06/2018  . Cannabis abuse 12/06/2018  . Schizoaffective disorder (HCC) 12/05/2018    History reviewed. No pertinent surgical history.  Prior to Admission medications   Medication Sig Start Date End Date Taking? Authorizing Provider  benztropine (COGENTIN) 0.5 MG tablet Take 1 tablet (0.5 mg total) by mouth at bedtime. 12/10/18   Clapacs, Jackquline Denmark, MD  haloperidol (HALDOL) 5 MG tablet Take 1 tablet (5 mg total) by mouth at bedtime. 12/10/18   Clapacs, Jackquline Denmark, MD  haloperidol decanoate (HALDOL DECANOATE) 100 MG/ML injection Inject 1 mL (100 mg total) into the muscle every 30 (thirty) days. 12/10/18   Clapacs, Jackquline Denmark, MD  ondansetron (ZOFRAN ODT) 4 MG disintegrating  tablet Take 1 tablet (4 mg total) by mouth every 8 (eight) hours as needed for nausea or vomiting. 11/05/19   Minna Antis, MD  traZODone (DESYREL) 50 MG tablet Take 1 tablet (50 mg total) by mouth at bedtime as needed for sleep. 12/10/18   Clapacs, Jackquline Denmark, MD    Allergies Patient has no known allergies.  No family history on file.  Social History Social History   Tobacco Use  . Smoking status: Current Every Day Smoker  . Smokeless tobacco: Never Used  Substance Use Topics  . Alcohol use: No  . Drug use: Not Currently    Types: Marijuana      Review of Systems Constitutional: No fever/chills Eyes: No visual changes. ENT: No sore throat. Cardiovascular: Denies chest pain. Respiratory: Denies shortness of breath. Gastrointestinal: No abdominal pain.  No nausea, no vomiting.  No diarrhea.  No constipation. Genitourinary: Negative for dysuria. Musculoskeletal: Negative for back pain. Skin: Negative for rash. Neurological: Negative for headaches, focal weakness or numbness. All other ROS negative ____________________________________________   PHYSICAL EXAM:  VITAL SIGNS: ED Triage Vitals  Enc Vitals Group     BP 12/18/19 2302 133/79     Pulse Rate 12/18/19 2302 (!) 125     Resp 12/18/19 2302 18     Temp 12/18/19 2302 98.6 F (37 C)     Temp Source 12/18/19 2302 Oral     SpO2 12/18/19 2302 98 %  Weight 12/18/19 2303 205 lb (93 kg)     Height 12/18/19 2303 5\' 9"  (1.753 m)     Head Circumference --      Peak Flow --      Pain Score 12/18/19 2303 0     Pain Loc --      Pain Edu? --      Excl. in GC? --     Constitutional: Alert and oriented. Well appearing and in no acute distress. Eyes: Conjunctivae are normal. EOMI. Head: Atraumatic. Nose: No congestion/rhinnorhea. Mouth/Throat: Mucous membranes are moist.   Neck: No stridor. Trachea Midline. FROM Cardiovascular: Normal rate, regular rhythm. Grossly normal heart sounds.  Good peripheral  circulation. Respiratory: Normal respiratory effort.  No retractions. Lungs CTAB. Gastrointestinal: Soft and nontender. No distention. No abdominal bruits.  Musculoskeletal: No lower extremity tenderness nor edema.  No joint effusions. Neurologic:  Normal speech and language. No gross focal neurologic deficits are appreciated.  Skin:  Skin is warm, dry and intact. No rash noted. Psychiatric: Mood and affect are normal. Speech and behavior are normal. GU: Deferred   ____________________________________________   LABS (all labs ordered are listed, but only abnormal results are displayed)  Labs Reviewed  CBC WITH DIFFERENTIAL/PLATELET - Abnormal; Notable for the following components:      Result Value   WBC 12.6 (*)    Lymphs Abs 4.9 (*)    Monocytes Absolute 1.1 (*)    All other components within normal limits  SARS CORONAVIRUS 2 BY RT PCR (HOSPITAL ORDER, PERFORMED IN [facility])  COMPREHENSIVE METABOLIC PANEL  SALICYLATE LEVEL  ACETAMINOPHEN LEVEL  ETHANOL  URINE DRUG SCREEN, QUALITATIVE ([facility] ONLY)   ____________________________________________   INITIAL IMPRESSION / ASSESSMENT AND PLAN / ED COURSE  [place] Kulish was evaluated in Emergency Department on 12/19/2019 for the symptoms described in the history of present illness. He was evaluated in the context of the global COVID-19 pandemic, which necessitated consideration that the patient might be at risk for infection with the SARS-CoV-2 virus that causes COVID-19. Institutional protocols and algorithms that pertain to the evaluation of patients at risk for COVID-19 are in a state of rapid change based on information released by regulatory bodies including the CDC and federal and state organizations. These policies and algorithms were followed during the patient's care in the ED.    Pt is without any acute medical complaints. No exam findings to suggest medical cause of current presentation. Will order psychiatric  screening labs and discuss further w/ psychiatric service.  D/d includes but is not limited to psychiatric disease, behavioral/personality disorder, inadequate socioeconomic support, medical.  Based on HPI, exam, unremarkable labs, no concern for acute medical problem at this time. No rigidity, clonus, hyperthermia, focal neurologic deficit, diaphoresis, tachycardia, meningismus, ataxia, gait abnormality or other finding to suggest this visit represents a non-psychiatric problem. Screening labs reviewed.    Given this, pt medically cleared, to be dispositioned per Psych.  The patient has been placed in psychiatric observation due to the need to provide a safe environment for the patient while obtaining psychiatric consultation and evaluation, as well as ongoing medical and medication management to treat the patient's condition.  The patient has been placed under full IVC at this time.        ____________________________________________   FINAL CLINICAL IMPRESSION(S) / ED DIAGNOSES   Final diagnoses:  Evaluation by psychiatric service required      MEDICATIONS GIVEN DURING THIS VISIT:  Medications - No data to display  ED Discharge Orders    None       Note:  This document was prepared using Dragon voice recognition software and may include unintentional dictation errors.   Concha Se, MD 12/19/19 [phone]

## 2019-12-19 NOTE — ED Notes (Signed)
Pt reports he went to his mothers house due to a situation he was in that caused him to feel as though someone was going to hurt his family. Pt states he went to protect his family. Pt denies SI/HI at this time and wants to "go home to my girl."

## 2019-12-19 NOTE — BH Assessment (Signed)
Patient has been accepted to [facility].  Patient assigned to [facility] Accepting physician is Dr. Forrestine Him.  Call report to [phone].  Representative was [employer].   ER Staff is aware of it:  Rivka Barbara, ER Secretary  Dr. Roxan Hockey, ER MD  Amy B., Patient's Nurse  Address: [address], [place], [place] [address]

## 2019-12-19 NOTE — ED Notes (Signed)
Psych NP and TTS at bedside with patient

## 2019-12-19 NOTE — BH Assessment (Signed)
Referral information for Psychiatric Hospitalization faxed to;   . [facility] ([phone]-or- [phone]),   . [facility] ([phone]---[phone]---[phone]),  . [facility] ([phone]),   . [facility] ([phone] -or- [phone]),    

## 2019-12-19 NOTE — BH Assessment (Signed)
Assessment Note  Nicholas Barron is an 24 y.o. male presenting to [facility] ED under IVC. Per triage note Pt to the er for IVC with [place]. Pt states he went to his mother's house to protect her due to some trouble he had gotten into. Pt states he went to the kitchen to get a knife to protect his family. Pt is calm and cooperative in triage.  Per IVC papers taken out by mom, pt had left a homicidal note on 12/15/2019 and would not come back to get IVC till tonight. Mom locked herself in bathroom until police arrived. During assessment patient appears alert and oriented x4, calm but withdrawn and with some denial. Patient reported "I got into some stress stuff, something happened at my girl house, and my mom assumed that I was going to do something." Patient denies any intentions of wanting to hurt his mother. Also per IVC paperwork patient returned to mothers house tonight to talk about the homicidal note and pulled a knife out in front of mother, mother then locked herself in the bathroom until police arrived. Patient is denying everything that happened tonight and also denies writing a note. Patient is not currently seeing a psychiatrist and has not been on any medication. Patient does have a recent hospitalization in 2020 for threatening behavior towards his family in the context of confusion and paranoia, patient was then diagnosed with Schizoaffective Disorder during his treatment. Patient denies SI/HI/AH/VH and does not appear to be responding to any internal or external stimuli.  Per Psyc NP patient is recommended for Inpatient Hospitalization.   Diagnosis: Schizoaffective Disorder  Past Medical History:  Past Medical History:  Diagnosis Date  . Schizophrenia (HCC)     History reviewed. No pertinent surgical history.  Family History: No family history on file.  Social History:  reports that he has been smoking. He has never used smokeless tobacco. He reports previous drug use. Drug: Marijuana.  He reports that he does not drink alcohol.  Additional Social History:  Alcohol / Drug Use Pain Medications: See MAR Prescriptions: See MAR Over the Counter: See MAR History of alcohol / drug use?: No history of alcohol / drug abuse  CIWA: CIWA-Ar BP: 133/79 Pulse Rate: (!) 125 COWS:    Allergies: No Known Allergies  Home Medications: (Not in a hospital admission)   OB/GYN Status:  No LMP for male patient.  General Assessment Data Location of Assessment: [facility] ED TTS Assessment: In system Is this a Tele or Face-to-Face Assessment?: Face-to-Face Is this an Initial Assessment or a Re-assessment for this encounter?: Initial Assessment Living Arrangements: Other (Comment) What gender do you identify as?: Male Marital status: Single Pregnancy Status: No Living Arrangements: Spouse/significant other Can pt return to current living arrangement?: Yes Admission Status: Involuntary Petitioner: Police Is patient capable of signing voluntary admission?: No Referral Source: Other Insurance type: None  Medical Screening Exam [facility] Walk-in ONLY) Medical Exam completed: Yes  Crisis Care Plan Living Arrangements: Spouse/significant other Legal Guardian: Other: (Self) Name of Psychiatrist: None Name of Therapist: None  Education Status Is patient currently in school?: No Is the patient employed, unemployed or receiving disability?: Employed  Risk to self with the past 6 months Suicidal Ideation: No Has patient been a risk to self within the past 6 months prior to admission? : No Suicidal Intent: No Has patient had any suicidal intent within the past 6 months prior to admission? : No Is patient at risk for suicide?: No Suicidal Plan?: No  Has patient had any suicidal plan within the past 6 months prior to admission? : No Access to Means: No What has been your use of drugs/alcohol within the last 12 months?: None reported Previous Attempts/Gestures: No How many times?: 0 Other  Self Harm Risks: None Triggers for Past Attempts: None known Intentional Self Injurious Behavior: None Family Suicide History: No Recent stressful life event(s): Other (Comment) (None reported) Persecutory voices/beliefs?: No Depression: No Substance abuse history and/or treatment for substance abuse?: No Suicide prevention information given to non-admitted patients: Not applicable  Risk to Others within the past 6 months Homicidal Ideation: No Does patient have any lifetime risk of violence toward others beyond the six months prior to admission? : No Thoughts of Harm to Others: No Current Homicidal Intent: No Current Homicidal Plan: No Access to Homicidal Means: No Identified Victim: None History of harm to others?: No Assessment of Violence: None Noted Violent Behavior Description: None Does patient have access to weapons?: No Criminal Charges Pending?: No Does patient have a court date: No Is patient on probation?: No  Psychosis Hallucinations: None noted Delusions: None noted  Mental Status Report Appearance/Hygiene: In scrubs Eye Contact: Good Motor Activity: Freedom of movement Speech: Logical/coherent Level of Consciousness: Alert Mood: Pleasant Affect: Appropriate to circumstance Anxiety Level: None Thought Processes: Coherent Judgement: Unimpaired Orientation: Person, Place, Time, Situation, Appropriate for developmental age Obsessive Compulsive Thoughts/Behaviors: None  Cognitive Functioning Concentration: Normal Memory: Recent Intact, Remote Intact Is patient IDD: No Insight: Poor Impulse Control: Poor Appetite: Good Have you had any weight changes? : No Change Sleep: No Change Total Hours of Sleep: 8 Vegetative Symptoms: None  ADLScreening [facility]) Patient's cognitive ability adequate to safely complete daily activities?: Yes Patient able to express need for assistance with ADLs?: Yes Independently performs ADLs?: Yes  (appropriate for developmental age)  Prior Inpatient Therapy Prior Inpatient Therapy: Yes Prior Therapy Dates: 2020 Prior Therapy Facilty/Provider(s): [facility] Reason for Treatment: Schizoaffective Disorder  Prior Outpatient Therapy Prior Outpatient Therapy: No Does patient have an ACCT team?: No Does patient have Intensive In-House Services?  : No Does patient have [facility] services? : No Does patient have P4CC services?: No  ADL Screening (condition at time of admission) Patient's cognitive ability adequate to safely complete daily activities?: Yes Is the patient deaf or have difficulty hearing?: Yes Does the patient have difficulty seeing, even when wearing glasses/contacts?: No Does the patient have difficulty concentrating, remembering, or making decisions?: No Patient able to express need for assistance with ADLs?: Yes Does the patient have difficulty dressing or bathing?: No Independently performs ADLs?: Yes (appropriate for developmental age) Does the patient have difficulty walking or climbing stairs?: No Weakness of Legs: None Weakness of Arms/Hands: None  Home Assistive Devices/Equipment Home Assistive Devices/Equipment: None  Therapy Consults (therapy consults require a physician order) PT Evaluation Needed: No OT Evalulation Needed: No SLP Evaluation Needed: No Abuse/Neglect Assessment (Assessment to be complete while patient is alone) Abuse/Neglect Assessment Can Be Completed: Yes Physical Abuse: Denies Verbal Abuse: Denies Sexual Abuse: Denies Exploitation of patient/patient's resources: Denies Self-Neglect: Denies Values / Beliefs Cultural Requests During Hospitalization: None Spiritual Requests During Hospitalization: None Consults Spiritual Care Consult Needed: No Transition of Care Team Consult Needed: No Advance Directives (For Healthcare) Does Patient Have a Medical Advance Directive?: No          Disposition: Per Psyc NP patient is  recommended for Inpatient Hospitalization.  Disposition Initial Assessment Completed for this Encounter: Yes  On Site Evaluation  by:   Reviewed with Physician:    Benay Pike MS LCASA 12/19/2019 4:26 AM

## 2019-12-19 NOTE — ED Notes (Signed)
RN attempted to call report. No answer 
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
## 2020-03-29 ENCOUNTER — Other Ambulatory Visit: Payer: Self-pay

## 2020-03-29 ENCOUNTER — Emergency Department
Admission: EM | Admit: 2020-03-29 | Discharge: 2020-03-30 | Disposition: A | Discharge status: Home / Self Care | Payer: Medicaid Other | Attending: Emergency Medicine | Admitting: Emergency Medicine

## 2020-03-29 DIAGNOSIS — Z91199 Patient's noncompliance with other medical treatment and regimen due to unspecified reason: Secondary | ICD-10-CM

## 2020-03-29 DIAGNOSIS — F2089 Other schizophrenia: Secondary | ICD-10-CM | POA: Insufficient documentation

## 2020-03-29 DIAGNOSIS — R4689 Other symptoms and signs involving appearance and behavior: Secondary | ICD-10-CM

## 2020-03-29 DIAGNOSIS — F17200 Nicotine dependence, unspecified, uncomplicated: Secondary | ICD-10-CM | POA: Insufficient documentation

## 2020-03-29 DIAGNOSIS — Z9119 Patient's noncompliance with other medical treatment and regimen: Secondary | ICD-10-CM

## 2020-03-29 DIAGNOSIS — F209 Schizophrenia, unspecified: Secondary | ICD-10-CM | POA: Diagnosis present

## 2020-03-29 DIAGNOSIS — F1210 Cannabis abuse, uncomplicated: Secondary | ICD-10-CM | POA: Diagnosis present

## 2020-03-29 DIAGNOSIS — R456 Violent behavior: Secondary | ICD-10-CM | POA: Insufficient documentation

## 2020-03-29 DIAGNOSIS — Z20822 Contact with and (suspected) exposure to covid-19: Secondary | ICD-10-CM | POA: Insufficient documentation

## 2020-03-29 LAB — COMPREHENSIVE METABOLIC PANEL
ALT: 16 U/L (ref 0–44)
AST: 26 U/L (ref 15–41)
Albumin: 4.5 g/dL (ref 3.5–5.0)
Alkaline Phosphatase: 46 U/L (ref 38–126)
Anion gap: 8 (ref 5–15)
BUN: 11 mg/dL (ref 6–20)
CO2: 30 mmol/L (ref 22–32)
Calcium: 8.8 mg/dL — ABNORMAL LOW (ref 8.9–10.3)
Chloride: 104 mmol/L (ref 98–111)
Creatinine, Ser: 1.15 mg/dL (ref 0.61–1.24)
GFR, Estimated: >60 mL/min (ref >60)
Glucose, Bld: 113 mg/dL — ABNORMAL HIGH (ref 70–99)
Potassium: 3.3 mmol/L — ABNORMAL LOW (ref 3.5–5.1)
Sodium: 142 mmol/L (ref 135–145)
Total Bilirubin: 1.1 mg/dL (ref 0.3–1.2)
Total Protein: 7.6 g/dL (ref 6.5–8.1)

## 2020-03-29 LAB — URINE DRUG SCREEN, QUALITATIVE (ARMC ONLY)
Amphetamines, Ur Screen: NOT DETECTED
Barbiturates, Ur Screen: NOT DETECTED
Benzodiazepine, Ur Scrn: NOT DETECTED
Cannabinoid 50 Ng, Ur ~~LOC~~: POSITIVE — AB
Cocaine Metabolite,Ur ~~LOC~~: NOT DETECTED
MDMA (Ecstasy)Ur Screen: NOT DETECTED
Methadone Scn, Ur: NOT DETECTED
Opiate, Ur Screen: NOT DETECTED
Phencyclidine (PCP) Ur S: NOT DETECTED
Tricyclic, Ur Screen: NOT DETECTED

## 2020-03-29 LAB — RESPIRATORY PANEL BY RT PCR (FLU A&B, COVID)
Influenza A by PCR: NEGATIVE
Influenza B by PCR: NEGATIVE
SARS Coronavirus 2 by RT PCR: NEGATIVE

## 2020-03-29 LAB — CBC
HCT: 42.7 % (ref 39.0–52.0)
Hemoglobin: 14.1 g/dL (ref 13.0–17.0)
MCH: 28.8 pg (ref 26.0–34.0)
MCHC: 33 g/dL (ref 30.0–36.0)
MCV: 87.1 fL (ref 80.0–100.0)
Platelets: 291 10*3/uL (ref 150–400)
RBC: 4.9 MIL/uL (ref 4.22–5.81)
RDW: 15.2 % (ref 11.5–15.5)
WBC: 10.8 10*3/uL — ABNORMAL HIGH (ref 4.0–10.5)
nRBC: 0 % (ref 0.0–0.2)

## 2020-03-29 LAB — ETHANOL: Alcohol, Ethyl (B): <10 mg/dL (ref <10)
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
## 2020-03-29 NOTE — ED Notes (Signed)
Report to [facility], RN att

## 2020-03-29 NOTE — ED Notes (Addendum)
Pt dressed out by ed tech zach and this RN. Personal belongings placed in bah with pt label on outside. Belongings include, Cell phone, grey shorts, grey shirt, red boxers. Socks and  Black shoes.
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
## 2020-03-29 NOTE — ED Notes (Signed)
Dinner tray arrived from dietary and given to pt
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
## 2020-03-30 ENCOUNTER — Encounter: Payer: Self-pay | Admitting: Psychiatry

## 2020-03-30 ENCOUNTER — Other Ambulatory Visit: Payer: Self-pay

## 2020-03-30 ENCOUNTER — Inpatient Hospital Stay
Admission: RE | Admit: 2020-03-30 | Discharge: 2020-04-01 | DRG: 885 | Disposition: A | Discharge status: Home / Self Care | Payer: Self-pay | Source: Intra-hospital | Attending: Behavioral Health | Admitting: Behavioral Health

## 2020-03-30 DIAGNOSIS — F203 Undifferentiated schizophrenia: Secondary | ICD-10-CM | POA: Diagnosis present

## 2020-03-30 DIAGNOSIS — Z9114 Patient's other noncompliance with medication regimen: Secondary | ICD-10-CM

## 2020-03-30 DIAGNOSIS — F3160 Bipolar disorder, current episode mixed, unspecified: Principal | ICD-10-CM | POA: Diagnosis present

## 2020-03-30 DIAGNOSIS — F17210 Nicotine dependence, cigarettes, uncomplicated: Secondary | ICD-10-CM | POA: Diagnosis present

## 2020-03-30 LAB — LIPID PANEL
Cholesterol: 214 mg/dL — ABNORMAL HIGH (ref 0–200)
HDL: 59 mg/dL (ref >40)
LDL Cholesterol: 129 mg/dL — ABNORMAL HIGH (ref 0–99)
Total CHOL/HDL Ratio: 3.6 RATIO
Triglycerides: 128 mg/dL (ref <150)
VLDL: 26 mg/dL (ref 0–40)

## 2020-03-30 MED ORDER — HYDROXYZINE HCL 50 MG PO TABS
50.0000 mg | ORAL_TABLET | Freq: Three times a day (TID) | ORAL | Status: DC | PRN
  Administered 2020-03-31: 50 mg via ORAL
  Filled 2020-03-30: qty 1

## 2020-03-30 MED ORDER — ALUM & MAG HYDROXIDE-SIMETH 200-200-20 MG/5ML PO SUSP: 30.0000 mL | ORAL | Status: DC | PRN

## 2020-03-30 MED ORDER — MAGNESIUM HYDROXIDE 400 MG/5ML PO SUSP: 30.0000 mL | Freq: Every day | ORAL | Status: DC | PRN

## 2020-03-30 MED ORDER — HALOPERIDOL 5 MG PO TABS
5.0000 mg | ORAL_TABLET | Freq: Two times a day (BID) | ORAL | Status: DC
  Administered 2020-03-30: 5 mg via ORAL
  Filled 2020-03-30: qty 1

## 2020-03-30 MED ORDER — BENZTROPINE MESYLATE 1 MG PO TABS
0.5000 mg | ORAL_TABLET | Freq: Two times a day (BID) | ORAL | Status: DC
  Administered 2020-03-30 – 2020-04-01 (×4): 0.5 mg via ORAL
  Filled 2020-03-30 (×4): qty 1

## 2020-03-30 MED ORDER — HALOPERIDOL 5 MG PO TABS
5.0000 mg | ORAL_TABLET | Freq: Two times a day (BID) | ORAL | Status: DC
  Administered 2020-03-30 – 2020-04-01 (×4): 5 mg via ORAL
  Filled 2020-03-30 (×4): qty 1

## 2020-03-30 MED ORDER — ACETAMINOPHEN 325 MG PO TABS: 650.0000 mg | ORAL_TABLET | Freq: Four times a day (QID) | ORAL | Status: DC | PRN

## 2020-03-30 MED ORDER — BENZTROPINE MESYLATE 1 MG PO TABS
0.5000 mg | ORAL_TABLET | Freq: Two times a day (BID) | ORAL | Status: DC
  Administered 2020-03-30: 0.5 mg via ORAL
  Filled 2020-03-30: qty 1

## 2020-03-30 NOTE — ED Notes (Signed)
Hourly rounding reveals patient in room. No complaints, stable, in no acute distress. Q15 minute rounds and monitoring via [employer] Cam[employer] to continue. 
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
## 2020-03-30 NOTE — Tx Team (Signed)
Initial Treatment Plan 03/30/2020 10:29 PM [name] MCN:[id number]    PATIENT STRESSORS: Financial difficulties Marital or family conflict Medication change or noncompliance   PATIENT STRENGTHS: Capable of independent living Physical Health Other: has ACTT services and support   PATIENT IDENTIFIED PROBLEMS: Family dynamics Non compliance with medication                     DISCHARGE CRITERIA:  Adequate post-discharge living arrangements Improved stabilization in mood, thinking, and/or behavior Motivation to continue treatment in a less acute level of care  PRELIMINARY DISCHARGE PLAN: Attend aftercare/continuing care group Placement in alternative living arrangements  PATIENT/FAMILY INVOLVEMENT: This treatment plan has been presented to and reviewed with the patient, Nicholas Barron.  The patient has been given the opportunity to ask questions and make suggestions.  Nicholas Vernet L Butler-Nicholson, LPN [id number], 10:29 PM

## 2020-03-30 NOTE — BH Assessment (Addendum)
Patient is to be admitted to [facility] BMU by Dr. Clapacs.  Attending Physician will be Dr. Freeman.   Patient has been assigned to room 324, by [facility] Charge Nurse Gigi.    Dr. Paduchowski, ER MD   Amy B., Patient's Nurse   Anthony J., Patient Access. 

## 2020-03-30 NOTE — ED Notes (Signed)
Report called to Charge RN in BMU.  Patient to be transferred to room 324.

## 2020-03-30 NOTE — Plan of Care (Signed)
NEW ADMISSION  Problem: Education: Goal: Knowledge of [facility] Education information/materials will improve Outcome: Not Progressing Goal: Emotional status will improve Outcome: Not Progressing Goal: Mental status will improve Outcome: Not Progressing Goal: Verbalization of understanding the information provided will improve Outcome: Not Progressing   Problem: Activity: Goal: Interest or engagement in activities will improve Outcome: Not Progressing Goal: Sleeping patterns will improve Outcome: Not Progressing   Problem: Coping: Goal: Ability to verbalize frustrations and anger appropriately will improve Outcome: Not Progressing Goal: Ability to demonstrate self-control will improve Outcome: Not Progressing   Problem: Health Behavior/Discharge Planning: Goal: Identification of resources available to assist in meeting health care needs will improve Outcome: Not Progressing Goal: Compliance with treatment plan for underlying cause of condition will improve Outcome: Not Progressing   Problem: Physical Regulation: Goal: Ability to maintain clinical measurements within normal limits will improve Outcome: Not Progressing   Problem: Safety: Goal: Periods of time without injury will increase Outcome: Not Progressing   Problem: Activity: Goal: Will identify at least one activity in which they can participate Outcome: Not Progressing   Problem: Coping: Goal: Ability to identify and develop effective coping behavior will improve Outcome: Not Progressing Goal: Ability to interact with others will improve Outcome: Not Progressing Goal: Demonstration of participation in decision-making regarding own care will improve Outcome: Not Progressing Goal: Ability to use eye contact when communicating with others will improve Outcome: Not Progressing   Problem: Health Behavior/Discharge Planning: Goal: Identification of resources available to assist in meeting health care  needs will improve Outcome: Not Progressing   Problem: Self-Concept: Goal: Will verbalize positive feelings about self Outcome: Not Progressing

## 2020-03-30 NOTE — ED Notes (Signed)
IVC/pending psych consult 

## 2020-03-30 NOTE — ED Notes (Signed)
Pt. Transferred to BHU from ED to room 1 after screening for contraband. Report to include Situation, Background, Assessment and Recommendations from Noel RN. Pt. Oriented to unit including Q15 minute rounds as well as the security cameras for their protection. Patient is alert and oriented, warm and dry in no acute distress. Patient denies SI, HI, and AVH. Pt. Encouraged to let me know if needs arise.  

## 2020-03-31 DIAGNOSIS — F209 Schizophrenia, unspecified: Secondary | ICD-10-CM

## 2020-03-31 LAB — LIPID PANEL
Cholesterol: 203 mg/dL — ABNORMAL HIGH (ref 0–200)
HDL: 57 mg/dL (ref >40)
LDL Cholesterol: 118 mg/dL — ABNORMAL HIGH (ref 0–99)
Total CHOL/HDL Ratio: 3.6 RATIO
Triglycerides: 138 mg/dL (ref <150)
VLDL: 28 mg/dL (ref 0–40)

## 2020-03-31 LAB — HEMOGLOBIN A1C
Hgb A1c MFr Bld: 5.3 % (ref 4.8–5.6)
Mean Plasma Glucose: 105 mg/dL

## 2020-03-31 MED ORDER — HALOPERIDOL DECANOATE 100 MG/ML IM SOLN
100.0000 mg | Freq: Once | INTRAMUSCULAR | Status: AC
  Administered 2020-03-31: 100 mg via INTRAMUSCULAR
  Filled 2020-03-31 (×2): qty 1
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
## 2020-03-31 NOTE — Progress Notes (Signed)
Recreation Therapy Notes     Date: 03/31/2020  Time: 9:30 am    Location: Craft room   Behavioral response: Appropriate  Intervention Topic: Stress Management    Discussion/Intervention:  Group content on today was focused on stress. The group defined stress and way to cope with stress. Participants expressed how they know when they are stresses out. Individuals described the different ways they have to cope with stress. The group stated reasons why it is important to cope with stress. Patient explained what good stress is and some examples. The group participated in the intervention "Stress Management". Individuals were separated into two group and answered questions related to stress.  .  Clinical Observations/Feedback: Patient came to group and was focused on what peers and staff had to say about stress management. Individual was social with peers and staff while participating in the intervention.  Acacia Latorre LRT/CTRS        Bhavika Schnider 03/31/2020 11:40 AM
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
## 2020-03-31 NOTE — Progress Notes (Signed)
D: Pt alert and oriented. Pt rates depression 0/10, hopelessness 0/10, and anxiety 0/10. Pt goal: "Everything." Pt reports energy level as normal and concentration as being good. Pt reports sleep last night as being good. Pt did not receive medications for sleep. Pt denies experiencing any pain at this time. Pt denies experiencing any SI/HI, or AVH at this time.   A: Scheduled medications administered to pt, per MD orders. Support and encouragement provided. Frequent verbal contact made. Routine safety checks conducted q15 minutes.   R: No adverse drug reactions noted. Pt verbally contracts for safety at this time. Pt complaint with medications. Pt interacts well with others on the unit minimally. Pt remains safe at this time. Will continue to monitor.

## 2020-03-31 NOTE — [facility] Group Notes (Signed)
LCSW Group Therapy Note  03/31/2020 3:41 PM  Type of Therapy/Topic:  Group Therapy:  Feelings about Diagnosis  Participation Level:  None   Description of Group:   This group will allow patients to explore their thoughts and feelings about diagnoses they have received. Patients will be guided to explore their level of understanding and acceptance of these diagnoses. Facilitator will encourage patients to process their thoughts and feelings about the reactions of others to their diagnosis and will guide patients in identifying ways to discuss their diagnosis with significant others in their lives. This group will be process-oriented, with patients participating in exploration of their own experiences, giving and receiving support, and processing challenge from other group members.   Therapeutic Goals: 1. Patient will demonstrate understanding of diagnosis as evidenced by identifying two or more symptoms of the disorder 2. Patient will be able to express two feelings regarding the diagnosis 3. Patient will demonstrate their ability to communicate their needs through discussion and/or role play  Summary of Patient Progress: Patient was present for group, however, did not engage in group discussion.    Therapeutic Modalities:   Cognitive Behavioral Therapy Brief Therapy Feelings Identification   Penni Homans, MSW, LCSW 03/31/2020 3:41 PM
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
## 2020-03-31 NOTE — Progress Notes (Signed)
Recreation Therapy Notes  INPATIENT RECREATION THERAPY ASSESSMENT  Patient Details Name: Nicholas Barron MRN: [id number] DOB: 1996/01/07 Today's Date: 03/31/2020       Information Obtained From: Patient  Able to Participate in Assessment/Interview: Yes  Patient Presentation: Responsive  Reason for Admission (Per Patient): Active Symptoms  Patient Stressors: Family  Coping Skills:   Isolation, Avoidance  Leisure Interests (2+):  Social - Family (Play with dogs)  Frequency of Recreation/Participation:    Awareness of Community Resources:     Community Resources:     Current Use:    If no, Barriers?:    Expressed Interest in [employer] Information:    [place] of Residence:  Film/video editor  Patient Main Form of Transportation: Walk  Patient Strengths:  Out going  Patient Identified Areas of Improvement:  Ignore certain situations  Patient Goal for Hospitalization:  To stay out the hospital  Current SI (including self-harm):  No  Current HI:  No  Current AVH: No  Staff Intervention Plan: Group Attendance, Collaborate with Interdisciplinary Treatment Team  Consent to Intern Participation: N/A  Madsen Riddle 03/31/2020, 3:27 PM

## 2020-03-31 NOTE — [facility] Suicide Risk Assessment (Signed)
[facility] INPATIENT:  Family/Significant Other Suicide Prevention Education  Suicide Prevention Education:  Patient Refusal for Family/Significant Other Suicide Prevention Education: The patient Nicholas Barron has refused to provide written consent for family/significant other to be provided Family/Significant Other Suicide Prevention Education during admission and/or prior to discharge.  Physician notified.  SPE completed with pt, as pt refused to consent to family contact. SPI pamphlet provided to pt and pt was encouraged to share information with support network, ask questions, and talk about any concerns relating to SPE. Pt denies access to guns/firearms and verbalized understanding of information provided. Mobile Crisis information also provided to pt.  Glenis Smoker 03/31/2020, 3:20 PM

## 2020-04-01 ENCOUNTER — Other Ambulatory Visit: Payer: Self-pay | Admitting: Behavioral Health

## 2020-04-01 LAB — HEMOGLOBIN A1C
Hgb A1c MFr Bld: 5.3 % (ref 4.8–5.6)
Mean Plasma Glucose: 105 mg/dL

## 2020-04-01 MED ORDER — HALOPERIDOL DECANOATE 100 MG/ML IM SOLN
100.0000 mg | INTRAMUSCULAR | 3 refills | Status: DC
Start: 2020-04-01 — End: 2021-09-22

## 2020-04-01 MED ORDER — BENZTROPINE MESYLATE 0.5 MG PO TABS: 0.5000 mg | ORAL_TABLET | Freq: Two times a day (BID) | ORAL | 1 refills | Status: DC

## 2020-04-01 MED ORDER — HALOPERIDOL 5 MG PO TABS: 5.0000 mg | ORAL_TABLET | Freq: Two times a day (BID) | ORAL | 0 refills | Status: DC

## 2020-04-01 NOTE — Progress Notes (Signed)
Patient alert and oriented x 4, affect is blunted thoughts are organized and coherent no bizarre behavior noted, interacting appropriately with peers and staff no distress noted, complaint with medication regimen will continue to monitor.

## 2020-04-01 NOTE — [facility] Group Notes (Signed)
LCSW Group Therapy Note  04/01/2020 4:11 PM  Type of Therapy/Topic:  Group Therapy:  Emotion Regulation  Participation Level:  Minimal   Description of Group:   The purpose of this group is to assist patients in learning to regulate negative emotions and experience positive emotions. Patients will be guided to discuss ways in which they have been vulnerable to their negative emotions. These vulnerabilities will be juxtaposed with experiences of positive emotions or situations, and patients will be challenged to use positive emotions to combat negative ones. Special emphasis will be placed on coping with negative emotions in conflict situations, and patients will process healthy conflict resolution skills.  Therapeutic Goals: 1. Patient will identify two positive emotions or experiences to reflect on in order to balance out negative emotions 2. Patient will label two or more emotions that they find the most difficult to experience 3. Patient will demonstrate positive conflict resolution skills through discussion and/or role plays  Summary of Patient Progress: Patient participated in the icebreaker and after that was quiet in the back of the room.   Therapeutic Modalities:   Cognitive Behavioral Therapy Feelings Identification Dialectical Behavioral Therapy  Vilma Meckel. Algis Greenhouse, MSW, LCSW, LCAS 04/01/2020 4:11 PM
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
## 2020-04-01 NOTE — Progress Notes (Signed)
Recreation Therapy Notes   Date: 04/01/2020  Time: 9:30 am   Location: Craft room   Behavioral response: Appropriate  Intervention Topic: Happiness   Discussion/Intervention:  Group content today was focused on Happiness. The group defined happiness and described where happiness comes from. Individuals identified what makes them happy and how they go about making others happy. Patients expressed things that stop them from being happy and ways they can improve their happiness. The group stated reasons why it is important to be happy. The group participated in the intervention "My Happiness", where they had a chance to identify and express things that make them happy.  Clinical Observations/Feedback: Patient came to group and was focused on what peers and staff had to say about happiness. Individual was social with peers and staff while participating in the intervention.  Renay Crammer LRT/CTRS          Kailand Seda 04/01/2020 1:35 PM

## 2020-04-01 NOTE — Progress Notes (Signed)
D: Pt alert and oriented. Pt denies experiencing any depression/anxiety at this time. Pt denies experiencing any pain at this time. Pt denies experiencing any SI/HI, or AVH at this time.   A: Scheduled medications administered to pt, per MD orders. Support and encouragement provided. Frequent verbal contact made. Routine safety checks conducted q15 minutes.   R: No adverse drug reactions noted. Pt verbally contracts for safety at this time. Pt complaint with medications and treatment plan. Pt interacts well with others on the unit. Pt remains safe at this time. Will continue to monitor.

## 2020-04-01 NOTE — Progress Notes (Signed)
D: Pt alert and oriented. Pt denies experiencing any pain, SI/HI, or AVH at this time. Pt reports he will be able to keep himself safe when he returns home.   A: Pt received discharge and medication education/information. Pt belongings were returned and signed for at this time to include printed prescriptions and a 7 day supply of medication.   R: Pt verbalized understanding of discharge and medication education/information.  Pt escorted by staff to the physician's parking where safe transport picked pt up and transported home.

## 2020-04-01 NOTE — Tx Team (Signed)
Interdisciplinary Treatment and Diagnostic Plan Update  04/01/2020 Time of Session: 9:00AM Nicholas Barron MRN: [id number]  Principal Diagnosis: <principal problem not specified>  Secondary Diagnoses: Active Problems:   Schizophrenia, undifferentiated (HCC)   Current Medications:  Current Facility-Administered Medications  Medication Dose Route Frequency Provider Last Rate Last Admin  . acetaminophen (TYLENOL) tablet 650 mg  650 mg Oral Q6H PRN Clapacs, John T, MD      . alum & mag hydroxide-simeth (MAALOX/MYLANTA) 200-200-20 MG/5ML suspension 30 mL  30 mL Oral Q4H PRN Clapacs, John T, MD      . benztropine (COGENTIN) tablet 0.5 mg  0.5 mg Oral BID Clapacs, John T, MD   0.5 mg at 04/01/20 9678  . haloperidol (HALDOL) tablet 5 mg  5 mg Oral BID Clapacs, Jackquline Denmark, MD   5 mg at 04/01/20 0803  . hydrOXYzine (ATARAX/VISTARIL) tablet 50 mg  50 mg Oral TID PRN Clapacs, Jackquline Denmark, MD   50 mg at 03/31/20 2123  . magnesium hydroxide (MILK OF MAGNESIA) suspension 30 mL  30 mL Oral Daily PRN Clapacs, Jackquline Denmark, MD       PTA Medications: Medications Prior to Admission  Medication Sig Dispense Refill Last Dose  . benztropine (COGENTIN) 0.5 MG tablet Take 1 tablet (0.5 mg total) by mouth at bedtime. (Patient not taking: Reported on 03/29/2020) 30 tablet 1   . haloperidol (HALDOL) 5 MG tablet Take 1 tablet (5 mg total) by mouth at bedtime. (Patient not taking: Reported on 03/29/2020) 30 tablet 0   . ondansetron (ZOFRAN ODT) 4 MG disintegrating tablet Take 1 tablet (4 mg total) by mouth every 8 (eight) hours as needed for nausea or vomiting. (Patient not taking: Reported on 03/29/2020) 20 tablet 0   . traZODone (DESYREL) 50 MG tablet Take 1 tablet (50 mg total) by mouth at bedtime as needed for sleep. (Patient not taking: Reported on 03/29/2020) 30 tablet 1   . [DISCONTINUED] haloperidol decanoate (HALDOL DECANOATE) 100 MG/ML injection Inject 1 mL (100 mg total) into the muscle every 30 (thirty) days. (Patient  not taking: Reported on 03/29/2020) 1 mL 1     Patient Stressors: Financial difficulties Marital or family conflict Medication change or noncompliance  Patient Strengths: Capable of independent living Physical Health Other: has ACTT services and support  Treatment Modalities: Medication Management, Group therapy, Case management,  1 to 1 session with clinician, Psychoeducation, Recreational therapy.   Physician Treatment Plan for Primary Diagnosis: <principal problem not specified> Long Term Goal(s): Improvement in symptoms so as ready for discharge Improvement in symptoms so as ready for discharge   Short Term Goals: Ability to identify changes in lifestyle to reduce recurrence of condition will improve Ability to verbalize feelings will improve Compliance with prescribed medications will improve Ability to identify changes in lifestyle to reduce recurrence of condition will improve Ability to verbalize feelings will improve Compliance with prescribed medications will improve  Medication Management: Evaluate patient's response, side effects, and tolerance of medication regimen.  Therapeutic Interventions: 1 to 1 sessions, Unit Group sessions and Medication administration.  Evaluation of Outcomes: Adequate for Discharge  Physician Treatment Plan for Secondary Diagnosis: Active Problems:   Schizophrenia, undifferentiated (HCC)  Long Term Goal(s): Improvement in symptoms so as ready for discharge Improvement in symptoms so as ready for discharge   Short Term Goals: Ability to identify changes in lifestyle to reduce recurrence of condition will improve Ability to verbalize feelings will improve Compliance with prescribed medications will improve Ability to identify changes in  lifestyle to reduce recurrence of condition will improve Ability to verbalize feelings will improve Compliance with prescribed medications will improve     Medication Management: Evaluate patient's  response, side effects, and tolerance of medication regimen.  Therapeutic Interventions: 1 to 1 sessions, Unit Group sessions and Medication administration.  Evaluation of Outcomes: Adequate for Discharge   RN Treatment Plan for Primary Diagnosis: <principal problem not specified> Long Term Goal(s): Knowledge of disease and therapeutic regimen to maintain health will improve  Short Term Goals: Ability to verbalize frustration and anger appropriately will improve, Ability to demonstrate self-control, Ability to participate in decision making will improve, Ability to verbalize feelings will improve, Ability to identify and develop effective coping behaviors will improve and Compliance with prescribed medications will improve  Medication Management: RN will administer medications as ordered by provider, will assess and evaluate patient's response and provide education to patient for prescribed medication. RN will report any adverse and/or side effects to prescribing provider.  Therapeutic Interventions: 1 on 1 counseling sessions, Psychoeducation, Medication administration, Evaluate responses to treatment, Monitor vital signs and CBGs as ordered, Perform/monitor CIWA, COWS, AIMS and Fall Risk screenings as ordered, Perform wound care treatments as ordered.  Evaluation of Outcomes: Adequate for Discharge   LCSW Treatment Plan for Primary Diagnosis: <principal problem not specified> Long Term Goal(s): Safe transition to appropriate next level of care at discharge, Engage patient in therapeutic group addressing interpersonal concerns.  Short Term Goals: Engage patient in aftercare planning with referrals and resources, Increase social support, Increase ability to appropriately verbalize feelings, Increase emotional regulation and Facilitate acceptance of mental health diagnosis and concerns  Therapeutic Interventions: Assess for all discharge needs, 1 to 1 time with Social worker, Explore available  resources and support systems, Assess for adequacy in community support network, Educate family and significant other(s) on suicide prevention, Complete Psychosocial Assessment, Interpersonal group therapy.  Evaluation of Outcomes: Adequate for Discharge   Progress in Treatment: Attending groups: Yes. Participating in groups: No. Taking medication as prescribed: Yes. Toleration medication: Yes. Family/Significant other contact made: Yes, individual(s) contacted:  SPE completed with patient. Patient decliend SPE with collateral contact. Patient understands diagnosis: Yes. Discussing patient identified problems/goals with staff: Yes. Medical problems stabilized or resolved: Yes. Denies suicidal/homicidal ideation: Yes. Issues/concerns per patient self-inventory: No. Other: none  New problem(s) identified: No, Describe:  none  New Short Term/Long Term Goal(s): detox, elimination of symptoms of psychosis, medication management for mood stabilization; elimination of SI thoughts; development of comprehensive mental wellness/sobriety plan.  Patient Goals:  "somewhere else to live"  Discharge Plan or Barriers: Patient reports plans to stay with a peer.  He has indicated that he would like to continue services with [facility].    Reason for Continuation of Hospitalization: Aggression Anxiety Depression Medication stabilization  Estimated Length of Stay:   1-7 days  Attendees: Patient:  Nicholas Barron 04/01/2020 10:55 AM  Physician: Dr. Neale Burly, MD 04/01/2020 10:55 AM  Nursing: Jorene Minors, RN 04/01/2020 10:55 AM  RN Care Manager: 04/01/2020 10:55 AM  Social Worker: Penni Homans, LCSW 04/01/2020 10:55 AM  Recreational Therapist: Hilbert Bible, LRT 04/01/2020 10:55 AM  Other: Jillyn Hidden, LCSW 04/01/2020 10:55 AM  Other:  04/01/2020 10:55 AM  Other: 04/01/2020 10:55 AM    Scribe for Treatment Team: Harden Mo, LCSW 04/01/2020 10:55 AM

## 2020-04-01 NOTE — [facility] Suicide Risk Assessment (Signed)
[facility] Discharge Suicide Risk Assessment   Principal Problem: <principal problem not specified> Discharge Diagnoses: Active Problems:   Schizophrenia, undifferentiated (HCC)   Total Time spent with patient: 30 minutes  Musculoskeletal: Strength & Muscle Tone: within normal limits Gait & Station: normal Patient leans: N/A  Psychiatric Specialty Exam: Review of Systems  Constitutional: Negative for diaphoresis and fatigue.  HENT: Negative for rhinorrhea and sore throat.   Eyes: Negative for photophobia and visual disturbance.  Respiratory: Negative for cough and shortness of breath.   Cardiovascular: Negative for chest pain and palpitations.  Gastrointestinal: Negative for constipation, diarrhea, nausea and vomiting.  Endocrine: Negative for cold intolerance and heat intolerance.  Genitourinary: Negative for difficulty urinating and dysuria.  Musculoskeletal: Negative for back pain and neck pain.  Skin: Negative for rash and wound.  Allergic/Immunologic: Negative for environmental allergies and food allergies.  Neurological: Negative for dizziness and headaches.  Hematological: Negative for adenopathy. Does not bruise/bleed easily.  Psychiatric/Behavioral: Negative for dysphoric mood and suicidal ideas. The patient is not nervous/anxious and is not hyperactive.     Blood pressure 118/79, pulse 61, temperature 97.9 F (36.6 C), temperature source Oral, resp. rate 17, height 5\' 9"  (1.753 m), weight 93 kg, SpO2 100 %.Body mass index is 30.27 kg/m.  General Appearance: Well Groomed  ::  Good  Speech:  Clear and Coherent409  Volume:  Normal  Mood:  Euthymic  Affect:  Congruent  Thought Process:  Coherent and Linear  Orientation:  Full (Time, Place, and Person)  Thought Content:  Logical  Suicidal Thoughts:  No  Homicidal Thoughts:  No  Memory:  Immediate;   Fair Recent;   Fair Remote;   Fair  Judgement:  Fair  Insight:  Fair  Psychomotor Activity:  Normal   Concentration:  Good  Recall:  Good  Fund of Knowledge:Good  Language: Good  Akathisia:  Negative  Handed:  Right  AIMS (if indicated):     Assets:  Communication Skills Desire for Improvement Physical Health Resilience Social Support  Sleep:  Number of Hours: 7.75  Cognition: WNL  ADL's:  Intact   Mental Status Per Nursing Assessment::   On Admission:  NA  Demographic Factors:  Male and Adolescent or young adult  Loss Factors: NA  Historical Factors: Impulsivity  Risk Reduction Factors:   Responsible for children under 68 years of age, Sense of responsibility to family, Positive social support, Positive therapeutic relationship and Positive coping skills or problem solving skills  Continued Clinical Symptoms:  Schizophrenia:   Paranoid or undifferentiated type  Cognitive Features That Contribute To Risk:  None    Suicide Risk:  Minimal: No identifiable suicidal ideation.  Patients presenting with no risk factors but with morbid ruminations; may be classified as minimal risk based on the severity of the depressive symptoms    Plan Of Care/Follow-up recommendations:  Activity:  as tolerated Diet:  regular diet  15, MD 04/01/2020, 10:05 AM

## 2020-04-06 ENCOUNTER — Encounter: Payer: Self-pay | Admitting: *Deleted

## 2020-04-06 ENCOUNTER — Other Ambulatory Visit: Payer: Self-pay

## 2020-04-06 DIAGNOSIS — F1290 Cannabis use, unspecified, uncomplicated: Secondary | ICD-10-CM | POA: Insufficient documentation

## 2020-04-06 LAB — COMPREHENSIVE METABOLIC PANEL
ALT: 15 U/L (ref 0–44)
AST: 26 U/L (ref 15–41)
Albumin: 4.1 g/dL (ref 3.5–5.0)
Alkaline Phosphatase: 45 U/L (ref 38–126)
Anion gap: 11 (ref 5–15)
BUN: 12 mg/dL (ref 6–20)
CO2: 24 mmol/L (ref 22–32)
Calcium: 8.9 mg/dL (ref 8.9–10.3)
Chloride: 104 mmol/L (ref 98–111)
Creatinine, Ser: 1.06 mg/dL (ref 0.61–1.24)
GFR, Estimated: >60 mL/min (ref >60)
Glucose, Bld: 124 mg/dL — ABNORMAL HIGH (ref 70–99)
Potassium: 3.7 mmol/L (ref 3.5–5.1)
Sodium: 139 mmol/L (ref 135–145)
Total Bilirubin: 1 mg/dL (ref 0.3–1.2)
Total Protein: 7 g/dL (ref 6.5–8.1)

## 2020-04-06 LAB — CBC
HCT: 42.3 % (ref 39.0–52.0)
Hemoglobin: 13.5 g/dL (ref 13.0–17.0)
MCH: 29 pg (ref 26.0–34.0)
MCHC: 31.9 g/dL (ref 30.0–36.0)
MCV: 90.8 fL (ref 80.0–100.0)
Platelets: 282 10*3/uL (ref 150–400)
RBC: 4.66 MIL/uL (ref 4.22–5.81)
RDW: 14.9 % (ref 11.5–15.5)
WBC: 15.7 10*3/uL — ABNORMAL HIGH (ref 4.0–10.5)
nRBC: 0 % (ref 0.0–0.2)

## 2020-04-06 LAB — URINE DRUG SCREEN, QUALITATIVE (ARMC ONLY)
Amphetamines, Ur Screen: NOT DETECTED
Barbiturates, Ur Screen: NOT DETECTED
Benzodiazepine, Ur Scrn: NOT DETECTED
Cannabinoid 50 Ng, Ur ~~LOC~~: POSITIVE — AB
Cocaine Metabolite,Ur ~~LOC~~: NOT DETECTED
MDMA (Ecstasy)Ur Screen: NOT DETECTED
Methadone Scn, Ur: NOT DETECTED
Opiate, Ur Screen: NOT DETECTED
Phencyclidine (PCP) Ur S: NOT DETECTED
Tricyclic, Ur Screen: NOT DETECTED

## 2020-04-06 LAB — LIPASE, BLOOD: Lipase: 22 U/L (ref 11–51)

## 2020-04-06 LAB — ETHANOL: Alcohol, Ethyl (B): <10 mg/dL (ref <10)

## 2020-04-06 NOTE — ED Notes (Signed)
Called pt for re-assessment with no answer 

## 2020-04-06 NOTE — ED Notes (Signed)
Pt to ER via EMS from home after drinking beer and moonshine as well as smoking marijuana.  PT in NAD at this time

## 2020-04-06 NOTE — ED Triage Notes (Addendum)
Pt brought in via ems from home with alcohol intoxication.  Pt reports he has been drinking moonshine all day today and started vomiting.  No vomiting in triage  Pt awake and talking in triage.  Denies SI or HI.  Denies drug use.  Pt calm and cooperative.

## 2020-04-07 ENCOUNTER — Emergency Department
Admission: EM | Admit: 2020-04-07 | Discharge: 2020-04-07 | Disposition: A | Discharge status: Home / Self Care | Payer: Medicaid Other | Attending: Emergency Medicine | Admitting: Emergency Medicine

## 2020-04-07 DIAGNOSIS — F1290 Cannabis use, unspecified, uncomplicated: Secondary | ICD-10-CM

## 2020-04-07 DIAGNOSIS — R112 Nausea with vomiting, unspecified: Secondary | ICD-10-CM

## 2020-04-07 MED ORDER — SODIUM CHLORIDE 0.9 % IV BOLUS
1000.0000 mL | Freq: Once | INTRAVENOUS | Status: AC
  Administered 2020-04-07: 1000 mL via INTRAVENOUS

## 2020-04-07 MED ORDER — ONDANSETRON 4 MG PO TBDP: 4.0000 mg | ORAL_TABLET | Freq: Three times a day (TID) | ORAL | 0 refills | Status: DC | PRN

## 2020-04-07 NOTE — ED Provider Notes (Signed)
[facility] Emergency Department Provider Note   ____________________________________________   First MD Initiated Contact with Patient 04/07/20 0102     (approximate)  I have reviewed the triage vital signs and the nursing notes.   HISTORY  Chief Complaint Alcohol Intoxication    HPI Nicholas Barron is a 24 y.o. male brought to the ED via EMS from home with a chief complaint of alcohol intoxication, nausea and vomiting.  Patient states he has been drinking moonshine all day and started vomiting.  Denies fever, chills, cough, chest pain, shortness of breath, abdominal pain, dysuria or diarrhea.      Past Medical History:  Diagnosis Date  . Schizophrenia [facility])     Patient Active Problem List   Diagnosis Date Noted  . Schizophrenia, undifferentiated (HCC) 03/30/2020  . Schizophrenia (HCC)   . Noncompliance 12/06/2018  . Cannabis abuse 12/06/2018  . Schizoaffective disorder (HCC) 12/05/2018    No past surgical history on file.  Prior to Admission medications   Medication Sig Start Date End Date Taking? Authorizing Provider  benztropine (COGENTIN) 0.5 MG tablet Take 1 tablet (0.5 mg total) by mouth 2 (two) times daily. 04/01/20   Jesse Sans, MD  haloperidol (HALDOL) 5 MG tablet Take 1 tablet (5 mg total) by mouth 2 (two) times daily. 04/01/20   Jesse Sans, MD  haloperidol decanoate (HALDOL DECANOATE) 100 MG/ML injection Inject 1 mL (100 mg total) into the muscle every 30 (thirty) days. 04/01/20   Jesse Sans, MD  ondansetron (ZOFRAN ODT) 4 MG disintegrating tablet Take 1 tablet (4 mg total) by mouth every 8 (eight) hours as needed for nausea or vomiting. 04/07/20   Irean Hong, MD    Allergies Patient has no known allergies.  No family history on file.  Social History Social History   Tobacco Use  . Smoking status: Former Games developer  . Smokeless tobacco: Never Used  Substance Use Topics  . Alcohol use: Yes  . Drug use: Not  Currently    Types: Marijuana    Review of Systems  Constitutional: No fever/chills Eyes: No visual changes. ENT: No sore throat. Cardiovascular: Denies chest pain. Respiratory: Denies shortness of breath. Gastrointestinal: No abdominal pain.  Positive for nausea and vomiting.  No diarrhea.  No constipation. Genitourinary: Negative for dysuria. Musculoskeletal: Negative for back pain. Skin: Negative for rash. Neurological: Negative for headaches, focal weakness or numbness.   ____________________________________________   PHYSICAL EXAM:  VITAL SIGNS: ED Triage Vitals  Enc Vitals Group     BP 04/06/20 1753 111/68     Pulse Rate 04/06/20 1753 62     Resp 04/06/20 1753 18     Temp 04/06/20 1753 98.6 F (37 C)     Temp Source 04/06/20 1753 Oral     SpO2 04/06/20 1753 97 %     Weight 04/06/20 1750 205 lb (93 kg)     Height 04/06/20 1750 5\' 9"  (1.753 m)     Head Circumference --      Peak Flow --      Pain Score 04/06/20 1750 2     Pain Loc --      Pain Edu? --      Excl. in GC? --     Constitutional: Asleep, awakened for exam.  Alert and oriented. Well appearing and in no acute distress. Eyes: Conjunctivae are bloodshot bilaterally. PERRL. EOMI. Head: Atraumatic. Nose: No congestion/rhinnorhea. Mouth/Throat: Mucous membranes are moist.   Neck: No stridor.  Cardiovascular: Normal rate, regular rhythm. Grossly normal heart sounds.  Good peripheral circulation. Respiratory: Normal respiratory effort.  No retractions. Lungs CTAB. Gastrointestinal: Soft and nontender to light or deep palpation. No distention. No abdominal bruits. No CVA tenderness. Musculoskeletal: No lower extremity tenderness nor edema.  No joint effusions. Neurologic:  Normal speech and language. No gross focal neurologic deficits are appreciated.  Skin:  Skin is warm, dry and intact. No rash noted. Psychiatric: Mood and affect are normal. Speech and behavior are  normal.  ____________________________________________   LABS (all labs ordered are listed, but only abnormal results are displayed)  Labs Reviewed  COMPREHENSIVE METABOLIC PANEL - Abnormal; Notable for the following components:      Result Value   Glucose, Bld 124 (*)    All other components within normal limits  CBC - Abnormal; Notable for the following components:   WBC 15.7 (*)    All other components within normal limits  URINE DRUG SCREEN, QUALITATIVE ([facility] ONLY) - Abnormal; Notable for the following components:   Cannabinoid 50 Ng, Ur [place] POSITIVE (*)    All other components within normal limits  ETHANOL  LIPASE, BLOOD   ____________________________________________  EKG  None ____________________________________________  RADIOLOGY I, Camarie Mctigue J, personally viewed and evaluated these images (plain radiographs) as part of my medical decision making, as well as reviewing the written report by the radiologist.  ED MD interpretation: None  Official radiology report(s): No results found.  ____________________________________________   PROCEDURES  Procedure(s) performed (including Critical Care):  Procedures   ____________________________________________   INITIAL IMPRESSION / ASSESSMENT AND PLAN / ED COURSE  As part of my medical decision making, I reviewed the following data within the electronic MEDICAL RECORD NUMBER Nursing notes reviewed and incorporated, Labs reviewed, Old chart reviewed (noted psychiatric evaluations) and Notes from prior ED visits     24 year old male presenting with nausea and vomiting after drinking moonshine. Differential diagnosis includes, but is not limited to, intoxication, substance use, biliary disease (biliary colic, acute cholecystitis, cholangitis, choledocholithiasis, etc), intrathoracic causes for epigastric abdominal pain including ACS, gastritis, duodenitis, pancreatitis, small bowel or large bowel obstruction, abdominal aortic  aneurysm, hernia, and ulcer(s).  Laboratory results unremarkable.  Patient is positive for cannabinoids.  States he is feeling better without intervention and has not vomited in quite some time.  Will administer IV hydration, PO challenge and reassess.  Clinical Course as of Apr 07 609  Tue Apr 07, 2020  6812 Patient tolerated PO without emesis.  Will discharge home with a prescription for Zofran ODT to use as needed.  Strict return precautions given.  Patient verbalizes understanding agrees with plan of care.   [JS]    Clinical Course User Index [JS] Irean Hong, MD     ____________________________________________   FINAL CLINICAL IMPRESSION(S) / ED DIAGNOSES  Final diagnoses:  Marijuana use  Non-intractable vomiting with nausea, unspecified vomiting type     ED Discharge Orders         Ordered    ondansetron (ZOFRAN ODT) 4 MG disintegrating tablet  Every 8 hours PRN        04/07/20 0355          *Please note:  Nicholas Barron was evaluated in Emergency Department on 04/07/2020 for the symptoms described in the history of present illness. He was evaluated in the context of the global COVID-19 pandemic, which necessitated consideration that the patient might be at risk for infection with the SARS-CoV-2 virus that causes COVID-19. Institutional protocols  and algorithms that pertain to the evaluation of patients at risk for COVID-19 are in a state of rapid change based on information released by regulatory bodies including the CDC and federal and state organizations. These policies and algorithms were followed during the patient's care in the ED.  Some ED evaluations and interventions may be delayed as a result of limited staffing during and the pandemic.*   Note:  This document was prepared using Dragon voice recognition software and may include unintentional dictation errors.   Irean Hong, MD 04/07/20 [id number]

## 2020-04-07 NOTE — Discharge Instructions (Addendum)
You may take Zofran as needed for nausea/vomiting.  Drink plenty of fluids daily.  Return to the ER for worsening symptoms, persistent vomiting, difficulty breathing or other concerns.

## 2020-10-01 ENCOUNTER — Other Ambulatory Visit (HOSPITAL_COMMUNITY): Payer: Self-pay

## 2021-07-05 ENCOUNTER — Emergency Department: Payer: Medicaid Other

## 2021-07-05 ENCOUNTER — Emergency Department
Admission: EM | Admit: 2021-07-05 | Discharge: 2021-07-05 | Disposition: A | Discharge status: Home / Self Care | Payer: Medicaid Other | Attending: Emergency Medicine | Admitting: Emergency Medicine

## 2021-07-05 ENCOUNTER — Other Ambulatory Visit: Payer: Self-pay

## 2021-07-05 ENCOUNTER — Encounter: Payer: Self-pay | Admitting: Emergency Medicine

## 2021-07-05 DIAGNOSIS — M7989 Other specified soft tissue disorders: Secondary | ICD-10-CM

## 2021-07-05 DIAGNOSIS — F17210 Nicotine dependence, cigarettes, uncomplicated: Secondary | ICD-10-CM | POA: Insufficient documentation

## 2021-07-05 DIAGNOSIS — R600 Localized edema: Secondary | ICD-10-CM | POA: Insufficient documentation

## 2021-07-05 LAB — CBC
HCT: 35.8 % — ABNORMAL LOW (ref 39.0–52.0)
Hemoglobin: 11 g/dL — ABNORMAL LOW (ref 13.0–17.0)
MCH: 24.6 pg — ABNORMAL LOW (ref 26.0–34.0)
MCHC: 30.7 g/dL (ref 30.0–36.0)
MCV: 79.9 fL — ABNORMAL LOW (ref 80.0–100.0)
Platelets: 326 10*3/uL (ref 150–400)
RBC: 4.48 MIL/uL (ref 4.22–5.81)
RDW: 15.8 % — ABNORMAL HIGH (ref 11.5–15.5)
WBC: 8.5 10*3/uL (ref 4.0–10.5)
nRBC: 0 % (ref 0.0–0.2)

## 2021-07-05 LAB — BASIC METABOLIC PANEL
Anion gap: 7 (ref 5–15)
BUN: 9 mg/dL (ref 6–20)
CO2: 26 mmol/L (ref 22–32)
Calcium: 8.8 mg/dL — ABNORMAL LOW (ref 8.9–10.3)
Chloride: 103 mmol/L (ref 98–111)
Creatinine, Ser: 1.1 mg/dL (ref 0.61–1.24)
GFR, Estimated: >60 mL/min (ref >60)
Glucose, Bld: 89 mg/dL (ref 70–99)
Potassium: 3.8 mmol/L (ref 3.5–5.1)
Sodium: 136 mmol/L (ref 135–145)

## 2021-07-05 LAB — TROPONIN I (HIGH SENSITIVITY): Troponin I (High Sensitivity): 5 ng/L (ref <18)

## 2021-07-05 LAB — BRAIN NATRIURETIC PEPTIDE: B Natriuretic Peptide: 5.3 pg/mL (ref 0.0–100.0)

## 2021-07-05 MED ORDER — LIDOCAINE-EPINEPHRINE 2 %-1:100000 IJ SOLN
1.7000 mL | Freq: Once | INTRAMUSCULAR | Status: DC
  Filled 2021-07-05: qty 1.7

## 2021-07-05 NOTE — Discharge Instructions (Signed)
Please try to keep lower extremities elevated above your heart.  Wear compression stockings throughout the day to help with swelling in both lower extremities.  Return to the ER for any shortness of breath, chest pain, cough, worsening symptoms or to changes in your health.

## 2021-07-05 NOTE — ED Triage Notes (Signed)
Pt presents via POV with c/o bilateral feet and leg swelling. Pt denies cardiac symptoms or shortness of breath. States noted swelling 3 days ago. Pt denies any recent injuries. Pt c/o minimal pain with walking. Pt denies cardiac hx.

## 2021-07-05 NOTE — ED Provider Notes (Signed)
[facility] EMERGENCY DEPARTMENT Provider Note   CSN: [id number] Arrival date & time: 07/05/21  1224      Chief Complaint  Patient presents with   Leg Swelling    Nicholas Barron is a 26 y.o. male presents to the emergency department for several days of bilateral lower extremity pain and swelling.  He states his feet ankles and lower legs feel very tight.  He denies any chest pain, shortness of breath.  No falls trauma or injury.  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  He is currently living in his car and spends most of his time that he is not working at the warehouse sitting in his car.  He smokes cigarettes.  No history of blood clots.  No known clotting disorders.  He has not trying to have a compression stockings.  HPI     Home Medications Prior to Admission medications   Medication Sig Start Date End Date Taking? Authorizing Provider  benztropine (COGENTIN) 0.5 MG tablet Take 1 tablet (0.5 mg total) by mouth 2 (two) times daily. 04/01/20   Jesse Sans, MD  haloperidol (HALDOL) 5 MG tablet TAKE 1 TABLET BY MOUTH TWO TIMES DAILY 04/01/20 04/01/21  Jesse Sans, MD  haloperidol decanoate (HALDOL DECANOATE) 100 MG/ML injection Inject 1 mL (100 mg total) into the muscle every 30 (thirty) days. 04/01/20   Jesse Sans, MD  ondansetron (ZOFRAN ODT) 4 MG disintegrating tablet Take 1 tablet (4 mg total) by mouth every 8 (eight) hours as needed for nausea or vomiting. 04/07/20   Irean Hong, MD      Allergies    Patient has no known allergies.    Review of Systems   Review of Systems  Physical Exam Updated Vital Signs BP 120/80 (BP Location: Left Arm)    Pulse 95    Temp 99.6 F (37.6 C) (Oral)    Resp 20    Ht 5\' 8"  (1.727 m)    Wt 117.9 kg    SpO2 98%    BMI 39.53 kg/m  Physical Exam Constitutional:      Appearance: He is well-developed.  HENT:     Head: Normocephalic and atraumatic.   Eyes:     Conjunctiva/sclera: Conjunctivae normal.  Cardiovascular:     Rate and Rhythm: Normal rate.  Pulmonary:     Effort: Pulmonary effort is normal. No respiratory distress.     Breath sounds: Normal breath sounds. No wheezing or rales.  Abdominal:     General: Bowel sounds are normal. There is no distension.     Tenderness: There is no abdominal tenderness.  Musculoskeletal:        General: Swelling present. Normal range of motion.     Cervical back: Normal range of motion.     Right lower leg: Edema present.     Left lower leg: Edema present.     Comments: Compartments are soft in both lower extremities and he is neurovascular intact with 2+ dorsalis pedis pulses bilaterally.  He has mild pitting edema from both mid tibias down into the foot and ankles bilaterally.  Left leg is slightly worse than the right.  Mild tenderness in the left calf, no tenderness on the right.  Skin:    General: Skin is warm.     Findings: No rash.  Neurological:     Mental Status: He is alert and oriented  to person, place, and time.  Psychiatric:        Behavior: Behavior normal.        Thought Content: Thought content normal.    ED Results / Procedures / Treatments   Labs (all labs ordered are listed, but only abnormal results are displayed) Labs Reviewed  BASIC METABOLIC PANEL - Abnormal; Notable for the following components:      Result Value   Calcium 8.8 (*)    All other components within normal limits  CBC - Abnormal; Notable for the following components:   Hemoglobin 11.0 (*)    HCT 35.8 (*)    MCV 79.9 (*)    MCH 24.6 (*)    RDW 15.8 (*)    All other components within normal limits  BRAIN NATRIURETIC PEPTIDE  TROPONIN I (HIGH SENSITIVITY)  TROPONIN I (HIGH SENSITIVITY)    EKG None  Radiology US Venous Img Lower Bilateral (DVT)  Result Date: 07/05/2021 CLINICAL DATA:  Bilateral leg pain and swelling for several days EXAM: BILATERAL LOWER EXTREMITY VENOUS DOPPLER  ULTRASOUND TECHNIQUE: Gray-scale sonography with graded compression, as well as color Doppler and duplex ultrasound were performed to evaluate the lower extremity deep venous systems from the level of the common femoral vein and including the common femoral, femoral, profunda femoral, popliteal and calf veins including the posterior tibial, peroneal and gastrocnemius veins when visible. The superficial great saphenous vein was also interrogated. Spectral Doppler was utilized to evaluate flow at rest and with distal augmentation maneuvers in the common femoral, femoral and popliteal veins. COMPARISON:  None. FINDINGS: RIGHT LOWER EXTREMITY Common Femoral Vein: No evidence of thrombus. Normal compressibility, respiratory phasicity and response to augmentation. Saphenofemoral Junction: No evidence of thrombus. Normal compressibility and flow on color Doppler imaging. Profunda Femoral Vein: No evidence of thrombus. Normal compressibility and flow on color Doppler imaging. Femoral Vein: No evidence of thrombus. Normal compressibility, respiratory phasicity and response to augmentation. Popliteal Vein: No evidence of thrombus. Normal compressibility, respiratory phasicity and response to augmentation. Calf Veins: No evidence of thrombus. Normal compressibility and flow on color Doppler imaging. Superficial Great Saphenous Vein: No evidence of thrombus. Normal compressibility. Venous Reflux:  None. Other Findings:  None. LEFT LOWER EXTREMITY Common Femoral Vein: No evidence of thrombus. Normal compressibility, respiratory phasicity and response to augmentation. Saphenofemoral Junction: No evidence of thrombus. Normal compressibility and flow on color Doppler imaging. Profunda Femoral Vein: No evidence of thrombus. Normal compressibility and flow on color Doppler imaging. Femoral Vein: No evidence of thrombus. Normal compressibility, respiratory phasicity and response to augmentation. Popliteal Vein: No evidence of  thrombus. Normal compressibility, respiratory phasicity and response to augmentation. Calf Veins: No evidence of thrombus. Normal compressibility and flow on color Doppler imaging. Superficial Great Saphenous Vein: No evidence of thrombus. Normal compressibility. Venous Reflux:  None. Other Findings:  None. IMPRESSION: No evidence of deep venous thrombosis in either lower extremity. Electronically Signed   By: Inez Catalina M.D.   On: 07/05/2021 22:46    Procedures Procedures    Medications Ordered in ED Medications - No data to display  ED Course/ Medical Decision Making/ A&P                           Medical Decision Making Amount and/or Complexity of Data Reviewed Labs: ordered. ECG/medicine tests: ordered.    Final Clinical Impression(s) / ED Diagnoses Final diagnoses:  Bilateral lower extremity edema  26 year old male with bilateral lower extremity swelling.  No signs of cellulitis, no warmth or redness.  Very mild pain in the left leg compared to the right.  No trauma or injury.  Vital signs stable.  CBC and BMP normal.  Normal troponin and BNP.  Wells score showed patient with high probability for DVT and ultrasound was ordered for both lower extremities and negative.  Patient advised to keep both lower extremities elevated above his heart, he will purchase over-the-counter compression stockings.  He understands signs and symptoms to return to the ER for.  Rx / DC Orders ED Discharge Orders     None         Renata Caprice 07/05/21 2335    Blake Divine, MD 07/05/21 2351

## 2021-09-15 ENCOUNTER — Other Ambulatory Visit: Payer: Self-pay

## 2021-09-15 ENCOUNTER — Emergency Department
Admission: EM | Admit: 2021-09-15 | Discharge: 2021-09-16 | Disposition: A | Discharge status: Other Acute Inpatient Hospital | Payer: Self-pay | Attending: Emergency Medicine | Admitting: Emergency Medicine

## 2021-09-15 DIAGNOSIS — R45851 Suicidal ideations: Secondary | ICD-10-CM | POA: Insufficient documentation

## 2021-09-15 DIAGNOSIS — F259 Schizoaffective disorder, unspecified: Secondary | ICD-10-CM | POA: Diagnosis present

## 2021-09-15 DIAGNOSIS — F1210 Cannabis abuse, uncomplicated: Secondary | ICD-10-CM | POA: Insufficient documentation

## 2021-09-15 DIAGNOSIS — R441 Visual hallucinations: Secondary | ICD-10-CM

## 2021-09-15 DIAGNOSIS — Z20822 Contact with and (suspected) exposure to covid-19: Secondary | ICD-10-CM | POA: Insufficient documentation

## 2021-09-15 DIAGNOSIS — F203 Undifferentiated schizophrenia: Secondary | ICD-10-CM | POA: Insufficient documentation

## 2021-09-15 DIAGNOSIS — D649 Anemia, unspecified: Secondary | ICD-10-CM | POA: Insufficient documentation

## 2021-09-15 DIAGNOSIS — R440 Auditory hallucinations: Secondary | ICD-10-CM

## 2021-09-15 DIAGNOSIS — Y900 Blood alcohol level of less than 20 mg/100 ml: Secondary | ICD-10-CM | POA: Insufficient documentation

## 2021-09-15 DIAGNOSIS — Z91199 Patient's noncompliance with other medical treatment and regimen due to unspecified reason: Secondary | ICD-10-CM

## 2021-09-15 DIAGNOSIS — E039 Hypothyroidism, unspecified: Secondary | ICD-10-CM | POA: Insufficient documentation

## 2021-09-15 DIAGNOSIS — F209 Schizophrenia, unspecified: Secondary | ICD-10-CM | POA: Diagnosis present

## 2021-09-15 LAB — COMPREHENSIVE METABOLIC PANEL
ALT: 9 U/L (ref 0–44)
AST: 17 U/L (ref 15–41)
Albumin: 4.4 g/dL (ref 3.5–5.0)
Alkaline Phosphatase: 57 U/L (ref 38–126)
Anion gap: 10 (ref 5–15)
BUN: 13 mg/dL (ref 6–20)
CO2: 27 mmol/L (ref 22–32)
Calcium: 8.9 mg/dL (ref 8.9–10.3)
Chloride: 103 mmol/L (ref 98–111)
Creatinine, Ser: 1.23 mg/dL (ref 0.61–1.24)
GFR, Estimated: >60 mL/min (ref >60)
Glucose, Bld: 91 mg/dL (ref 70–99)
Potassium: 3 mmol/L — ABNORMAL LOW (ref 3.5–5.1)
Sodium: 140 mmol/L (ref 135–145)
Total Bilirubin: 0.9 mg/dL (ref 0.3–1.2)
Total Protein: 8.1 g/dL (ref 6.5–8.1)

## 2021-09-15 LAB — CBC
HCT: 40.5 % (ref 39.0–52.0)
Hemoglobin: 12.4 g/dL — ABNORMAL LOW (ref 13.0–17.0)
MCH: 23.4 pg — ABNORMAL LOW (ref 26.0–34.0)
MCHC: 30.6 g/dL (ref 30.0–36.0)
MCV: 76.6 fL — ABNORMAL LOW (ref 80.0–100.0)
Platelets: 341 10*3/uL (ref 150–400)
RBC: 5.29 MIL/uL (ref 4.22–5.81)
RDW: 15.9 % — ABNORMAL HIGH (ref 11.5–15.5)
WBC: 9 10*3/uL (ref 4.0–10.5)
nRBC: 0 % (ref 0.0–0.2)

## 2021-09-15 LAB — SALICYLATE LEVEL: Salicylate Lvl: <7 mg/dL — ABNORMAL LOW (ref 7.0–30.0)

## 2021-09-15 LAB — ACETAMINOPHEN LEVEL: Acetaminophen (Tylenol), Serum: <10 ug/mL — ABNORMAL LOW (ref 10–30)

## 2021-09-15 LAB — ETHANOL: Alcohol, Ethyl (B): <10 mg/dL (ref <10)

## 2021-09-15 NOTE — ED Triage Notes (Signed)
Pt states he has been feeling paranoid for the past month. Reports Auditory and visual Hallucinations. Pt reports having SI without a plan. ?

## 2021-09-15 NOTE — ED Notes (Signed)
Pt given blanket.

## 2021-09-15 NOTE — ED Provider Notes (Addendum)
? ?[facility] ?Provider Note ? ? Event Date/Time  ? First MD Initiated Contact with Patient 09/15/21 2256   ?  (approximate) ?History  ?Suicidal and Paranoid ? ?HPI ?Nicholas Barron is a 26 y.o. male with a history of schizophrenia who presents complaining of increasing paranoia as well as auditory and visual hallucinations over the past month.  Patient also endorses having suicidal ideation without a specific plan.  Patient denies any recent increasing stress, nonadherence to medications, illicit drug use, alcohol abuse ?Physical Exam  ?Triage Vital Signs: ?ED Triage Vitals  ?Enc Vitals Group  ?   BP 09/15/21 2227 133/86  ?   Pulse Rate 09/15/21 2227 95  ?   Resp 09/15/21 2227 16  ?   Temp 09/15/21 2227 98.7 ?F (37.1 ?C)  ?   Temp Source 09/15/21 2227 Oral  ?   SpO2 09/15/21 2227 97 %  ?   Weight 09/15/21 2229 259 lb 14.8 oz (117.9 kg)  ?   Height --   ?   Head Circumference --   ?   Peak Flow --   ?   Pain Score 09/15/21 2229 0  ?   Pain Loc --   ?   Pain Edu? --   ?   Excl. in GC? --   ? ?Most recent vital signs: ?Vitals:  ? 09/15/21 2227  ?BP: 133/86  ?Pulse: 95  ?Resp: 16  ?Temp: 98.7 ?F (37.1 ?C)  ?SpO2: 97%  ? ?General: Awake, oriented x4. ?CV:  Good peripheral perfusion.  ?Resp:  Normal effort.  ?Abd:  No distention.  ?Other:  Patient is a young adult African-American overweight male laying in bed in no distress ?ED Results / Procedures / Treatments  ?Labs ?(all labs ordered are listed, but only abnormal results are displayed) ?Labs Reviewed  ?COMPREHENSIVE METABOLIC PANEL - Abnormal; Notable for the following components:  ?    Result Value  ? Potassium 3.0 (*)   ? All other components within normal limits  ?SALICYLATE LEVEL - Abnormal; Notable for the following components:  ? Salicylate Lvl <7.0 (*)   ? All other components within normal limits  ?ACETAMINOPHEN LEVEL - Abnormal; Notable for the following components:  ? Acetaminophen (Tylenol), Serum <10 (*)   ? All other components  within normal limits  ?CBC - Abnormal; Notable for the following components:  ? Hemoglobin 12.4 (*)   ? MCV 76.6 (*)   ? MCH 23.4 (*)   ? RDW 15.9 (*)   ? All other components within normal limits  ?ETHANOL  ?URINE DRUG SCREEN, QUALITATIVE ([facility] ONLY)  ? ?PROCEDURES: ?Critical Care performed: No ?Procedures ?MEDICATIONS ORDERED IN ED: ?Medications - No data to display ?IMPRESSION / MDM / ASSESSMENT AND PLAN / ED COURSE  ?I reviewed the triage vital signs and the nursing notes. ?             ?               ? ?Patient presents under IVC for hallucinations/delusions. Thoughts are disorganized. ?No history of prior suicide attempt, and no SI or HI at this time. ?Clinically w/ no overt toxidrome, low suspicion for ingestion given hx and exam Thoughts unlikely 2/2 anemia, hypothyroidism, infection, or ICH. ?Patient?s decision making capacity is compromised and they are unable to perform all ADL?s (additionally they are without appropriate caretakers to assist through this deficit). ? ?Consult: Psychiatry recommends inpatient treatment ?Disposition: Pending psychiatric inpatient hospitalization ? ?  ?FINAL CLINICAL  IMPRESSION(S) / ED DIAGNOSES  ? ?Final diagnoses:  ?Suicidal ideation  ?Auditory hallucinations  ?Visual hallucinations  ? ?Rx / DC Orders  ? ?ED Discharge Orders   ? ? None  ? ?  ? ?Note:  This document was prepared using Dragon voice recognition software and may include unintentional dictation errors. ?  ?Merwyn Katos, MD ?09/15/21 2354 ? ?  ?Merwyn Katos, MD ?09/15/21 2354 ? ?

## 2021-09-16 ENCOUNTER — Encounter: Payer: Self-pay | Admitting: Psychiatry

## 2021-09-16 ENCOUNTER — Inpatient Hospital Stay
Admission: AD | Admit: 2021-09-16 | Discharge: 2021-09-22 | DRG: 885 | Disposition: A | Discharge status: Home / Self Care | Payer: 59 | Source: Intra-hospital | Attending: Psychiatry | Admitting: Psychiatry

## 2021-09-16 DIAGNOSIS — Z87891 Personal history of nicotine dependence: Secondary | ICD-10-CM | POA: Diagnosis not present

## 2021-09-16 DIAGNOSIS — R45851 Suicidal ideations: Secondary | ICD-10-CM | POA: Diagnosis present

## 2021-09-16 DIAGNOSIS — F259 Schizoaffective disorder, unspecified: Principal | ICD-10-CM | POA: Diagnosis present

## 2021-09-16 DIAGNOSIS — F251 Schizoaffective disorder, depressive type: Secondary | ICD-10-CM

## 2021-09-16 DIAGNOSIS — F200 Paranoid schizophrenia: Secondary | ICD-10-CM

## 2021-09-16 DIAGNOSIS — Z20822 Contact with and (suspected) exposure to covid-19: Secondary | ICD-10-CM | POA: Diagnosis present

## 2021-09-16 LAB — URINE DRUG SCREEN, QUALITATIVE (ARMC ONLY)
Amphetamines, Ur Screen: NOT DETECTED
Barbiturates, Ur Screen: NOT DETECTED
Benzodiazepine, Ur Scrn: NOT DETECTED
Cannabinoid 50 Ng, Ur ~~LOC~~: POSITIVE — AB
Cocaine Metabolite,Ur ~~LOC~~: NOT DETECTED
MDMA (Ecstasy)Ur Screen: NOT DETECTED
Methadone Scn, Ur: NOT DETECTED
Opiate, Ur Screen: NOT DETECTED
Phencyclidine (PCP) Ur S: NOT DETECTED
Tricyclic, Ur Screen: NOT DETECTED

## 2021-09-16 LAB — BASIC METABOLIC PANEL
Anion gap: 4 — ABNORMAL LOW (ref 5–15)
BUN: 12 mg/dL (ref 6–20)
CO2: 28 mmol/L (ref 22–32)
Calcium: 8.5 mg/dL — ABNORMAL LOW (ref 8.9–10.3)
Chloride: 106 mmol/L (ref 98–111)
Creatinine, Ser: 1.05 mg/dL (ref 0.61–1.24)
GFR, Estimated: >60 mL/min (ref >60)
Glucose, Bld: 96 mg/dL (ref 70–99)
Potassium: 3.4 mmol/L — ABNORMAL LOW (ref 3.5–5.1)
Sodium: 138 mmol/L (ref 135–145)

## 2021-09-16 LAB — RESP PANEL BY RT-PCR (FLU A&B, COVID) ARPGX2
Influenza A by PCR: NEGATIVE
Influenza B by PCR: NEGATIVE
SARS Coronavirus 2 by RT PCR: NEGATIVE

## 2021-09-16 LAB — HEMOGLOBIN A1C
Hgb A1c MFr Bld: 5.2 % (ref 4.8–5.6)
Mean Plasma Glucose: 102.54 mg/dL

## 2021-09-16 MED ORDER — TRAZODONE HCL 100 MG PO TABS
100.0000 mg | ORAL_TABLET | Freq: Every evening | ORAL | Status: DC | PRN
  Administered 2021-09-20 – 2021-09-21 (×2): 100 mg via ORAL
  Filled 2021-09-16 (×3): qty 1

## 2021-09-16 MED ORDER — BENZTROPINE MESYLATE 1 MG PO TABS
0.5000 mg | ORAL_TABLET | Freq: Two times a day (BID) | ORAL | Status: DC
  Administered 2021-09-16: 0.5 mg via ORAL
  Filled 2021-09-16: qty 1

## 2021-09-16 MED ORDER — HALOPERIDOL 5 MG PO TABS
5.0000 mg | ORAL_TABLET | Freq: Two times a day (BID) | ORAL | Status: DC
  Administered 2021-09-16: 5 mg via ORAL
  Filled 2021-09-16: qty 1

## 2021-09-16 MED ORDER — HYDROXYZINE HCL 50 MG PO TABS
50.0000 mg | ORAL_TABLET | Freq: Four times a day (QID) | ORAL | Status: DC | PRN
  Administered 2021-09-17: 50 mg via ORAL
  Filled 2021-09-16 (×2): qty 1

## 2021-09-16 MED ORDER — MAGNESIUM HYDROXIDE 400 MG/5ML PO SUSP: 30.0000 mL | Freq: Every day | ORAL | Status: DC | PRN

## 2021-09-16 MED ORDER — HALOPERIDOL 5 MG PO TABS
5.0000 mg | ORAL_TABLET | Freq: Two times a day (BID) | ORAL | Status: DC
  Administered 2021-09-16 – 2021-09-22 (×12): 5 mg via ORAL
  Filled 2021-09-16 (×12): qty 1

## 2021-09-16 MED ORDER — BENZTROPINE MESYLATE 1 MG PO TABS
0.5000 mg | ORAL_TABLET | Freq: Two times a day (BID) | ORAL | Status: DC
  Administered 2021-09-16 – 2021-09-22 (×12): 0.5 mg via ORAL
  Filled 2021-09-16 (×12): qty 1

## 2021-09-16 MED ORDER — ACETAMINOPHEN 325 MG PO TABS
650.0000 mg | ORAL_TABLET | Freq: Four times a day (QID) | ORAL | Status: DC | PRN
  Filled 2021-09-16: qty 2

## 2021-09-16 MED ORDER — ALUM & MAG HYDROXIDE-SIMETH 200-200-20 MG/5ML PO SUSP: 30.0000 mL | ORAL | Status: DC | PRN

## 2021-09-16 NOTE — BH Assessment (Signed)
Comprehensive Clinical Assessment (CCA) Note ? ?09/16/2021 ?Nicholas Barron ?[id number] ? ?Chief Complaint: Patient is a 26 year old male presenting to [facility] ED voluntarily. Per triage note Pt states he has been feeling paranoid for the past month. Reports Auditory and visual Hallucinations. Pt reports having SI without a plan. During assessment patient appeared alert and oriented x4, calm and cooperative, patient's affect appeared flat, patient had some difficult expressing his symptoms and appeared guarded and paranoid. Patient was able to report "I was having different thoughts." Patient reports having SI "I thought about it", he denied any plan to want to hurt himself. Patient also report AH and VH. Patient also reports some marijuana use. Per patient's chart review patient was last admitted to [facility] in 2021 and diagnosed with schizophrenia. Patient reports SI/AH/VH, denies HI. ? ?Per Psyc NP Elenore PaddyJackie Thompson patient is recommended for Inpatient ?Chief Complaint  ?Patient presents with  ? Suicidal  ? Paranoid  ? ?Visit Diagnosis: Schizophrenia. Cannabis use  ? ? ?CCA Screening, Triage and Referral (STR) ? ?Patient Reported Information ?How did you hear about us? Self ? ?Referral name: No data recorded ?Referral phone number: No data recorded ? ?Whom do you see for routine medical problems? No data recorded ?Practice/Facility Name: No data recorded ?Practice/Facility Phone Number: No data recorded ?Name of Contact: No data recorded ?Contact Number: No data recorded ?Contact Fax Number: No data recorded ?Prescriber Name: No data recorded ?Prescriber Address (if known): No data recorded ? ?What Is the Reason for Your Visit/Call Today? Patient presents voluntarily due to paranoia and AH ? ?How Long Has This Been Causing You Problems? > than 6 months ? ?What Do You Feel Would Help You the Most Today? No data recorded ? ?Have You Recently Been in Any Inpatient Treatment (Hospital/Detox/Crisis Center/28-Day Program)? No  data recorded ?Name/Location of Program/Hospital:No data recorded ?How Long Were You There? No data recorded ?When Were You Discharged? No data recorded ? ?Have You Ever Received Services From [facility] Before? No data recorded ?Who Do You See at [facility]? No data recorded ? ?Have You Recently Had Any Thoughts About Hurting Yourself? Yes ? ?Are You Planning to Commit Suicide/Harm Yourself At This time? No ? ? ?Have you Recently Had Thoughts About Hurting Someone Karolee Ohslse? No ? ?Explanation: No data recorded ? ?Have You Used Any Alcohol or Drugs in the Past 24 Hours? Yes ? ?How Long Ago Did You Use Drugs or Alcohol? No data recorded ?What Did You Use and How Much? Marijuana ? ? ?Do You Currently Have a Therapist/Psychiatrist? No ? ?Name of Therapist/Psychiatrist: No data recorded ? ?Have You Been Recently Discharged From Any Office Practice or Programs? No ? ?Explanation of Discharge From Practice/Program: No data recorded ? ?  ?CCA Screening Triage Referral Assessment ?Type of Contact: Face-to-Face ? ?Is this Initial or Reassessment? No data recorded ?Date Telepsych consult ordered in [facility]:  No data recorded ?Time Telepsych consult ordered in [facility]:  No data recorded ? ?Patient Reported Information Reviewed? No data recorded ?Patient Left Without Being Seen? No data recorded ?Reason for Not Completing Assessment: No data recorded ? ?Collateral Involvement: No data recorded ? ?Does Patient Have a Automotive engineerCourt Appointed Legal Guardian? No data recorded ?Name and Contact of Legal Guardian: No data recorded ?If Minor and Not Living with Parent(s), Who has Custody? No data recorded ?Is CPS involved or ever been involved? Never ? ?Is APS involved or ever been involved? Never ? ? ?Patient Determined To Be At Risk for  Harm To Self or Others Based on Review of Patient Reported Information or Presenting Complaint? No ? ?Method: No data recorded ?Availability of Means: No data recorded ?Intent: No data recorded ?Notification Required:  No data recorded ?Additional Information for Danger to Others Potential: No data recorded ?Additional Comments for Danger to Others Potential: No data recorded ?Are There Guns or Other Weapons in Your Home? No data recorded ?Types of Guns/Weapons: No data recorded ?Are These Weapons Safely Secured?                            No data recorded ?Who Could Verify You Are Able To Have These Secured: No data recorded ?Do You Have any Outstanding Charges, Pending Court Dates, Parole/Probation? No data recorded ?Contacted To Inform of Risk of Harm To Self or Others: No data recorded ? ?Location of Assessment: [facility] ED ? ? ?Does Patient Present under Involuntary Commitment? No ? ?IVC Papers Initial File Date: No data recorded ? ?[place] of Residence: [place] ? ? ?Patient Currently Receiving the Following Services: No data recorded ? ?Determination of Need: Emergent (2 hours) ? ? ?Options For Referral: No data recorded ? ? ? ?CCA Biopsychosocial ?Intake/Chief Complaint:  No data recorded ?Current Symptoms/Problems: No data recorded ? ?Patient Reported Schizophrenia/Schizoaffective Diagnosis in Past: Yes ? ? ?Strengths: Patient is able to communicate his needs ? ?Preferences: No data recorded ?Abilities: No data recorded ? ?Type of Services Patient Feels are Needed: No data recorded ? ?Initial Clinical Notes/Concerns: No data recorded ? ?Mental Health Symptoms ?Depression:   ?Change in energy/activity; Difficulty Concentrating; Fatigue; Hopelessness ?  ?Duration of Depressive symptoms:  ?Greater than two weeks ?  ?Mania:   ?None ?  ?Anxiety:    ?None ?  ?Psychosis:   ?Delusions; Hallucinations ?  ?Duration of Psychotic symptoms:  ?Greater than six months ?  ?Trauma:   ?None ?  ?Obsessions:   ?None ?  ?Compulsions:   ?None ?  ?Inattention:   ?None ?  ?Hyperactivity/Impulsivity:   ?None ?  ?Oppositional/Defiant Behaviors:   ?None ?  ?Emotional Irregularity:   ?None ?  ?Other Mood/Personality Symptoms:  No data recorded  ? ?Mental  Status Exam ?Appearance and self-care  ?Stature:   ?Average ?  ?Weight:   ?Average weight ?  ?Clothing:   ?Casual ?  ?Grooming:   ?Normal ?  ?Cosmetic use:   ?None ?  ?Posture/gait:   ?Normal ?  ?Motor activity:   ?Not Remarkable ?  ?Sensorium  ?Attention:   ?Normal ?  ?Concentration:   ?Normal ?  ?Orientation:   ?X5 ?  ?Recall/memory:   ?Normal ?  ?Affect and Mood  ?Affect:   ?Flat ?  ?Mood:   ?Depressed ?  ?Relating  ?Eye contact:   ?Staring ?  ?Facial expression:   ?Responsive ?  ?Attitude toward examiner:   ?Cooperative; Guarded ?  ?Thought and Language  ?Speech flow:  ?Clear and Coherent ?  ?Thought content:   ?Appropriate to Mood and Circumstances ?  ?Preoccupation:   ?None ?  ?Hallucinations:   ?Auditory; Visual ?  ?Organization:  No data recorded  ?Executive Functions  ?Fund of Knowledge:   ?Fair ?  ?Intelligence:   ?Average ?  ?Abstraction:   ?Functional ?  ?Judgement:   ?Fair ?  ?Reality Testing:   ?Adequate ?  ?Insight:   ?Fair ?  ?Decision Making:   ?Normal ?  ?Social Functioning  ?Social Maturity:   ?Responsible ?  ?Social  Judgement:   ?Normal ?  ?Stress  ?Stressors:   ?Other (Comment) ?  ?Coping Ability:   ?Exhausted ?  ?Skill Deficits:   ?None ?  ?Supports:   ?Support needed ?  ? ? ?Religion: ?Religion/Spirituality ?Are You A Religious Person?: No ? ?Leisure/Recreation: ?Leisure / Recreation ?Do You Have Hobbies?: No ? ?Exercise/Diet: ?Exercise/Diet ?Do You Exercise?: No ?Have You Gained or Lost A Significant Amount of Weight in the Past Six Months?: No ?Do You Follow a Special Diet?: No ?Do You Have Any Trouble Sleeping?:  (Unknown) ? ? ?CCA Employment/Education ?Employment/Work Situation: ?Employment / Work Situation ?Employment Situation:  (Unknown) ?Patient's Job has Been Impacted by Current Illness: No ?Has Patient ever Been in the Military?: No ? ?Education: ?Education ?Is Patient Currently Attending School?: No ?Did You Have An Individualized Education Program (IIEP): No ?Did You Have Any  Difficulty At School?: No ?Patient's Education Has Been Impacted by Current Illness: No ? ? ?CCA Family/Childhood History ?Family and Relationship History: ?Family history ?Marital status: Single ?Does pat

## 2021-09-16 NOTE — BHH Suicide Risk Assessment (Signed)
[facility] Admission Suicide Risk Assessment ? ? ?Nursing information obtained from:  Patient ?Demographic factors:  Male, Unemployed ?Current Mental Status:  NA ?Loss Factors:  NA ?Historical Factors:  NA ?Risk Reduction Factors:  NA ? ?Total Time spent with patient: 1 hour ?Principal Problem: Schizoaffective disorder (HCC) ?Diagnosis:  Principal Problem: ?  Schizoaffective disorder (HCC) ? ?Subjective Data: Patient seen and chart reviewed.  26 year old man known to [place] from previous encounters.  Came to the emergency room seeking treatment.  Very reticent in his history but has endorsed some recent suicidal thoughts without any intention or plan or any acting on it.  Endorses paranoia and acknowledges some hallucinations.  So far cooperative with treatment ? ?Continued Clinical Symptoms:  ?Alcohol Use Disorder Identification Test Final Score (AUDIT): 1 ?The "Alcohol Use Disorders Identification Test", Guidelines for Use in Primary Care, Second Edition.  World Science writer [facility]). ?Score between 0-7:  no or low risk or alcohol related problems. ?Score between 8-15:  moderate risk of alcohol related problems. ?Score between 16-19:  high risk of alcohol related problems. ?Score 20 or above:  warrants further diagnostic evaluation for alcohol dependence and treatment. ? ? ?CLINICAL FACTORS:  ? Schizophrenia:   Depressive state ? ? ?Musculoskeletal: ?Strength & Muscle Tone: within normal limits ?Gait & Station: normal ?Patient leans: N/A ? ?Psychiatric Specialty Exam: ? ?Presentation  ?General Appearance: Appropriate for Environment ? ?Eye Contact:Good ? ?Speech:Clear and Coherent ? ?Speech Volume:Decreased ? ?Handedness:Right ? ? ?Mood and Affect  ?Mood:Euthymic ? ?Affect:Blunt; Congruent ? ? ?Thought Process  ?Thought Processes:Coherent ? ?Descriptions of Associations:Intact ? ?Orientation:Full (Time, Place and Person) ? ?Thought Content:Logical ? ?History of Schizophrenia/Schizoaffective disorder:Yes ? ?Duration of  Psychotic Symptoms:Greater than six months ? ?Hallucinations:Hallucinations: Auditory; Visual ?Description of Auditory Hallucinations: Patient states he hears voices ?Description of Visual Hallucinations: "See things noone see." ? ?Ideas of Reference:None ? ?Suicidal Thoughts:Suicidal Thoughts: No ? ?Homicidal Thoughts:Homicidal Thoughts: No ? ? ?Sensorium  ?Memory:Immediate Fair; Recent Fair; Remote Fair ? ?Judgment:Fair ? ?Insight:Fair ? ? ?Executive Functions  ?Concentration:Fair ? ?Attention Span:Fair ? ?Recall:Fair ? ?Fund of Knowledge:Fair ? ?Language:Fair ? ? ?Psychomotor Activity  ?Psychomotor Activity:Psychomotor Activity: Normal ? ? ?Assets  ?Assets:Communication Skills; Resilience; Social Support ? ? ?Sleep  ?Sleep:Sleep: Fair ? ? ? ?Physical Exam: ?Physical Exam ?Vitals and nursing note reviewed.  ?Constitutional:   ?   Appearance: Normal appearance.  ?HENT:  ?   Head: Normocephalic and atraumatic.  ?   Mouth/Throat:  ?   Pharynx: Oropharynx is clear.  ?Eyes:  ?   Pupils: Pupils are equal, round, and reactive to light.  ?Cardiovascular:  ?   Rate and Rhythm: Normal rate and regular rhythm.  ?Pulmonary:  ?   Effort: Pulmonary effort is normal.  ?   Breath sounds: Normal breath sounds.  ?Abdominal:  ?   General: Abdomen is flat.  ?   Palpations: Abdomen is soft.  ?Musculoskeletal:     ?   General: Normal range of motion.  ?Skin: ?   General: Skin is warm and dry.  ?Neurological:  ?   General: No focal deficit present.  ?   Mental Status: He is alert. Mental status is at baseline.  ?Psychiatric:     ?   Attention and Perception: He is inattentive.     ?   Mood and Affect: Mood is anxious. Affect is blunt.     ?   Speech: He is noncommunicative.     ?   Behavior: Behavior is slowed.     ?  Thought Content: Thought content is paranoid. Thought content includes suicidal ideation. Thought content does not include suicidal plan.     ?   Cognition and Memory: Cognition is impaired. Memory is impaired.  ? ?Review  of Systems  ?Constitutional: Negative.   ?HENT: Negative.    ?Eyes: Negative.   ?Respiratory: Negative.    ?Cardiovascular: Negative.   ?Gastrointestinal: Negative.   ?Musculoskeletal: Negative.   ?Skin: Negative.   ?Neurological: Negative.   ?Psychiatric/Behavioral:  Positive for hallucinations and suicidal ideas. Negative for depression and substance abuse. The patient is nervous/anxious. The patient does not have insomnia.   ?Blood pressure 125/85, pulse 94, temperature 98.7 ?F (37.1 ?C), temperature source Oral, resp. rate 17, height 5' 10.47" (1.79 m), weight 103.9 kg, SpO2 100 %. Body mass index is 32.42 kg/m?. ? ? ?COGNITIVE FEATURES THAT CONTRIBUTE TO RISK:  ?Thought constriction (tunnel vision)   ? ?SUICIDE RISK:  ? Minimal: No identifiable suicidal ideation.  Patients presenting with no risk factors but with morbid ruminations; may be classified as minimal risk based on the severity of the depressive symptoms ? ?PLAN OF CARE: Continue 15-minute checks.  Restart medicine.  Engage in individual and group assessment and treatment team assessment.  Reassess dangerousness prior to discharge ? ?I certify that inpatient services furnished can reasonably be expected to improve the patient's condition.  ? ?Mordecai Rasmussen, MD ?09/16/2021, 4:49 PM ? ?

## 2021-09-16 NOTE — Consult Note (Signed)
[facility] Face-to-Face Psychiatry Consult  ? ? ? ? ? ? ?Reason for Consult: Suicidal  ?Referring Physician:  Dr. Vicente Males ?Patient Identification: Nicholas Barron ?MRN:  [id number] ?Principal Diagnosis: <principal problem not specified> ?Diagnosis:  Active Problems: ?  Schizoaffective disorder (HCC) ?  Noncompliance ?  Cannabis abuse ?  Schizophrenia (HCC) ?  Schizophrenia, undifferentiated (HCC) ? ? ?Total Time spent with patient: 1.00 ? ?Subjective: "Something like that."  ?Nicholas Barron is a 26 y.o. male patient presented to [facility] ED voluntarily.Per the triage nurse's note Pt states he has been feeling paranoid for the past month. Reports Auditory and visual Hallucinations. Pt reports having SI without a plan. ?  ?This provider saw the patient face-to-face; the chart was reviewed, and consulted with Dr.Bradler on 09/15/2021 due to the patient's care. It was discussed with the EDP that the patient does meet the criteria to be admitted to the psychiatric inpatient unit.  ?On evaluation, the patient reports is alert and oriented x4, calm and cooperative, and mood-congruent with affect. The patient does not appear to be responding to internal or external stimuli. The patient presents with some delusional thinking. The patient admits to auditory or visual hallucinations. The patient denies any suicidal, homicidal, or self-harm ideations. The patient is not presenting with any psychotic or paranoid behaviors. During an encounter with the patient, he could answer questions appropriately. ? ?HPI: Dr. Vicente Males, Nicholas Barron is a 26 y.o. male with a history of schizophrenia who presents complaining of increasing paranoia as well as auditory and visual hallucinations over the past month.  Patient also endorses having suicidal ideation without a specific plan.  Patient denies any recent increasing stress, nonadherence to medications, illicit drug use, alcohol abuse ? ?Past Psychiatric History: Schizophrenia (HCC) ? ?Risk to Self:    ?Risk to Others:   ?Prior Inpatient Therapy:   ?Prior Outpatient Therapy:   ? ?Past Medical History:  ?Past Medical History:  ?Diagnosis Date  ? Schizophrenia (HCC)   ? History reviewed. No pertinent surgical history. ?Family History: History reviewed. No pertinent family history. ?Family Psychiatric  History:  ?Social History:  ?Social History  ? ?Substance and Sexual Activity  ?Alcohol Use Yes  ?   ?Social History  ? ?Substance and Sexual Activity  ?Drug Use Not Currently  ? Types: Marijuana  ?  ?Social History  ? ?Socioeconomic History  ? Marital status: Single  ?  Spouse name: Not on file  ? Number of children: Not on file  ? Years of education: Not on file  ? Highest education level: Not on file  ?Occupational History  ? Not on file  ?Tobacco Use  ? Smoking status: Former  ? Smokeless tobacco: Never  ?Substance and Sexual Activity  ? Alcohol use: Yes  ? Drug use: Not Currently  ?  Types: Marijuana  ? Sexual activity: Yes  ?Other Topics Concern  ? Not on file  ?Social History Narrative  ? Not on file  ? ?Social Determinants of Health  ? ?Financial Resource Strain: Not on file  ?Food Insecurity: Not on file  ?Transportation Needs: Not on file  ?Physical Activity: Not on file  ?Stress: Not on file  ?Social Connections: Not on file  ? ?Additional Social History: ?  ? ?Allergies:  No Known Allergies ? ?Labs:  ?Results for orders placed or performed during the hospital encounter of 09/15/21 (from the past 48 hour(s))  ?Comprehensive metabolic panel     Status: Abnormal  ? Collection Time: 09/15/21 10:34 PM  ?Result  Value Ref Range  ? Sodium 140 135 - 145 mmol/L  ? Potassium 3.0 (L) 3.5 - 5.1 mmol/L  ? Chloride 103 98 - 111 mmol/L  ? CO2 27 22 - 32 mmol/L  ? Glucose, Bld 91 70 - 99 mg/dL  ?  Comment: Glucose reference range applies only to samples taken after fasting for at least 8 hours.  ? BUN 13 6 - 20 mg/dL  ? Creatinine, Ser 1.23 0.61 - 1.24 mg/dL  ? Calcium 8.9 8.9 - 10.3 mg/dL  ? Total Protein 8.1 6.5 - 8.1  g/dL  ? Albumin 4.4 3.5 - 5.0 g/dL  ? AST 17 15 - 41 U/L  ? ALT 9 0 - 44 U/L  ? Alkaline Phosphatase 57 38 - 126 U/L  ? Total Bilirubin 0.9 0.3 - 1.2 mg/dL  ? GFR, Estimated >60 >60 mL/min  ?  Comment: (NOTE) ?Calculated using the CKD-EPI Creatinine Equation (2021) ?  ? Anion gap 10 5 - 15  ?  Comment: Performed at [facility], [address] [place], [place] [address]  ?Ethanol     Status: None  ? Collection Time: 09/15/21 10:34 PM  ?Result Value Ref Range  ? Alcohol, Ethyl (B) <10 <10 mg/dL  ?  Comment: (NOTE) ?Lowest detectable limit for serum alcohol is 10 mg/dL. ? ?For medical purposes only. ?Performed at [facility], [address] [place], ?[place] [address] ?  ?Salicylate level     Status: Abnormal  ? Collection Time: 09/15/21 10:34 PM  ?Result Value Ref Range  ? Salicylate Lvl <7.0 (L) 7.0 - 30.0 mg/dL  ?  Comment: Performed at [facility], [address] [place], [place] [address]  ?Acetaminophen level     Status: Abnormal  ? Collection Time: 09/15/21 10:34 PM  ?Result Value Ref Range  ? Acetaminophen (Tylenol), Serum <10 (L) 10 - 30 ug/mL  ?  Comment: (NOTE) ?Therapeutic concentrations vary significantly. A range of 10-30 ug/mL  ?may be an effective concentration for many patients. However, some  ?are best treated at concentrations outside of this range. ?Acetaminophen concentrations >150 ug/mL at 4 hours after ingestion  ?and >50 ug/mL at 12 hours after ingestion are often associated with  ?toxic reactions. ? ?Performed at [facility], [address] [place], ?[place] [address] ?  ?cbc     Status: Abnormal  ? Collection Time: 09/15/21 10:34 PM  ?Result Value Ref Range  ? WBC 9.0 4.0 - 10.5 K/uL  ? RBC 5.29 4.22 - 5.81 MIL/uL  ? Hemoglobin 12.4 (L) 13.0 - 17.0 g/dL  ? HCT 40.5 39.0 - 52.0 %  ? MCV 76.6 (L) 80.0 - 100.0 fL  ? MCH 23.4 (L) 26.0 - 34.0 pg  ? MCHC 30.6 30.0 - 36.0 g/dL  ? RDW 15.9 (H) 11.5 - 15.5 %  ? Platelets 341 150 - 400 K/uL  ? nRBC 0.0 0.0  - 0.2 %  ?  Comment: Performed at [facility], [address] [place], [place] [address]  ? ? ?No current facility-administered medications for this encounter.  ? ?Current Outpatient Medications  ?Medication Sig Dispense Refill  ? benztropine (COGENTIN) 0.5 MG tablet Take 1 tablet (0.5 mg total) by mouth 2 (two) times daily. (Patient not taking: Reported on 09/15/2021) 60 tablet 1  ? haloperidol (HALDOL) 5 MG tablet TAKE 1 TABLET BY MOUTH TWO TIMES DAILY 28 tablet 0  ? haloperidol decanoate (HALDOL DECANOATE) 100 MG/ML injection Inject 1 mL (100 mg total)  into the muscle every 30 (thirty) days. (Patient not taking: Reported on 09/15/2021) 1 mL 3  ? ondansetron (ZOFRAN ODT) 4 MG disintegrating tablet Take 1 tablet (4 mg total) by mouth every 8 (eight) hours as needed for nausea or vomiting. (Patient not taking: Reported on 09/15/2021) 20 tablet 0  ? ? ?Musculoskeletal: ?Strength & Muscle Tone: within normal limits ?Gait & Station: normal ?Patient leans: N/A ? ?Psychiatric Specialty Exam: ? ?Presentation  ?General Appearance: Appropriate for Environment ? ?Eye Contact:Good ? ?Speech:Clear and Coherent ? ?Speech Volume:Decreased ? ?Handedness:Right ? ? ?Mood and Affect  ?Mood:Euthymic ? ?Affect:Blunt; Congruent ? ? ?Thought Process  ?Thought Processes:Coherent ? ?Descriptions of Associations:Intact ? ?Orientation:Full (Time, Place and Person) ? ?Thought Content:Logical ? ?History of Schizophrenia/Schizoaffective disorder:No data recorded ?Duration of Psychotic Symptoms:No data recorded ?Hallucinations:Hallucinations: Auditory; Visual ?Description of Auditory Hallucinations: Patient states he hears voices ?Description of Visual Hallucinations: "See things noone see." ? ?Ideas of Reference:None ? ?Suicidal Thoughts:Suicidal Thoughts: No ? ?Homicidal Thoughts:Homicidal Thoughts: No ? ? ?Sensorium  ?Memory:Immediate Fair; Recent Fair; Remote Fair ? ?Judgment:Fair ? ?Insight:Fair ? ? ?Executive Functions   ?Concentration:Fair ? ?Attention Span:Fair ? ?Recall:Fair ? ?Fund of Knowledge:Fair ? ?Language:Fair ? ? ?Psychomotor Activity  ?Psychomotor Activity:Psychomotor Activity: Normal ? ? ?Assets  ?Assets:Communicati

## 2021-09-16 NOTE — ED Notes (Signed)
VS assess. Shower offered. No other needs found at this moment.  ?

## 2021-09-16 NOTE — Progress Notes (Signed)
Patient calm and pleasant during assessment denying SI/HI. Pt endorses hearing voices but stated "they aren't that bad right now." Pt observed interacting appropriately with staff and peers on the unit. Pt didn't have any medications scheduled tonight and hasn't requested anything PRN. Pt given education, support, and encouragement to be active in his treatment plan. Pt being monitored Q 15 minutes for safety per unit protocol. Pt remains safe on the unit.  ?

## 2021-09-16 NOTE — Tx Team (Signed)
Initial Treatment Plan ?09/16/2021 ?3:27 PM ?Nicholas Barron ?[id number] ? ? ? ?PATIENT STRESSORS: ?Other: patient did not identify    ? ? ?PATIENT STRENGTHS: ?Physical Health  ? ? ?PATIENT IDENTIFIED PROBLEMS: ?Auditory and visual hallucinations, thoughts of self harm  ?  ?  ?  ?  ?  ?  ?  ?  ?  ? ?DISCHARGE CRITERIA:  ?Improved stabilization in mood, thinking, and/or behavior ? ?PRELIMINARY DISCHARGE PLAN: ?Return to previous living arrangement ? ?PATIENT/FAMILY INVOLVEMENT: ?This treatment plan has been presented to and reviewed with the patient, Nicholas Barron. The patient and family have been given the opportunity to ask questions and make suggestions. ? ?Hyman Hopes, RN ?09/16/2021, 3:27 PM ?

## 2021-09-16 NOTE — BH Assessment (Addendum)
[facility]U unable to accept due to staffing issues ? ?Referral information for Psychiatric Hospitalization faxed to;  ? ?[facility] [phone]),  ? ?[facility] [phone]), ? ?[facility] [phone]),  ? ?[facility] [phone] -[phone]),  ? ?[facility] [phone]). ? ? ?

## 2021-09-16 NOTE — ED Notes (Signed)
VOL/ Moved to BHU-4 ?

## 2021-09-16 NOTE — ED Notes (Signed)
Patient reports visual and auditory hallucinations. States hallucinations are "all sorts of stuff" and "not good". ?

## 2021-09-16 NOTE — H&P (Signed)
Psychiatric Admission Assessment Adult ? ?Patient Identification: Nicholas Barron ?MRN:  [id number] ?Date of Evaluation:  09/16/2021 ?Chief Complaint:  Schizoaffective disorder (HCC) [F25.9] ?Principal Diagnosis: Schizoaffective disorder (HCC) ?Diagnosis:  Principal Problem: ?  Schizoaffective disorder (HCC) ? ?History of Present Illness: 26 year old man with a past history of mental health problems presented voluntarily to the emergency room seeking help.  Patient was vague in his assessment when he came in and he is very vague with his history now but clearly is suffering some degree of distress.  He told me that "a lot is going on" and "a lot of changes are happening".  I attempted gently to get him to expand on that and be more specific but he was not able to do so.  He indicated that he has been having suicidal thoughts but without any specific plan or intent and could not explain any reason for it.  He acknowledged having hallucinations but declined or could not explain any details about it.  Was to hesitant to even tell me about his living situation just telling me that he lives "with some people".  Denies homicidal ideation.  He has not been on any psychiatric medicine in at least several months.  Not following up with outpatient treatment.  Denies alcohol or drug use but then acknowledges some cannabis use when the drug screen is pointed out. ?Associated Signs/Symptoms: ?Depression Symptoms:  depressed mood, ?psychomotor retardation, ?hopelessness, ?suicidal thoughts without plan, ?Duration of Depression Symptoms: Greater than two weeks ? ?(Hypo) Manic Symptoms:  Distractibility, ?Anxiety Symptoms:  Excessive Worry, ?Psychotic Symptoms:  Hallucinations: Auditory ?Paranoia, ?PTSD Symptoms: ?Negative ?Total Time spent with patient: 1 hour ? ?Past Psychiatric History: Patient has a history of psychotic symptoms going back about 3 years maybe more than that.  We have seen him several times for admission to the  hospital or visits to the emergency room with psychosis.  His presentation is often very much like what we see today with him distressed but unable to articulate exactly what is on his mind.  He has done well on haloperidol in the past and was on haloperidol decanoate after his last hospitalization but that was over a year ago.  Although he frequently has reported suicidal ideation and there is no known history of actual suicide attempts.  Cannabis usage has complicated things in the past and has had some problems with alcohol in the past as well but he denies that he has been drinking. ? ?Is the patient at risk to self? Yes.    ?Has the patient been a risk to self in the past 6 months? Yes.    ?Has the patient been a risk to self within the distant past? Yes.    ?Is the patient a risk to others? No.  ?Has the patient been a risk to others in the past 6 months? No.  ?Has the patient been a risk to others within the distant past? No.  ? ?Prior Inpatient Therapy:   ?Prior Outpatient Therapy:   ? ?Alcohol Screening: Patient refused Alcohol Screening Tool: Yes ?1. How often do you have a drink containing alcohol?: Monthly or less ?2. How many drinks containing alcohol do you have on a typical day when you are drinking?: 1 or 2 ?3. How often do you have six or more drinks on one occasion?: Never ?AUDIT-C Score: 1 ?4. How often during the last year have you found that you were not able to stop drinking once you had started?: Never ?5. How  often during the last year have you failed to do what was normally expected from you because of drinking?: Never ?6. How often during the last year have you needed a first drink in the morning to get yourself going after a heavy drinking session?: Never ?7. How often during the last year have you had a feeling of guilt of remorse after drinking?: Never ?8. How often during the last year have you been unable to remember what happened the night before because you had been drinking?:  Never ?9. Have you or someone else been injured as a result of your drinking?: No ?10. Has a relative or friend or a doctor or another health worker been concerned about your drinking or suggested you cut down?: No ?Alcohol Use Disorder Identification Test Final Score (AUDIT): 1 ?Substance Abuse History in the last 12 months:  Yes.   ?Consequences of Substance Abuse: ?Alcohol and cannabis have both complicated illness in the past to some degree.  Currently no evidence of alcohol but he does seem to still be using marijuana ?Previous Psychotropic Medications: Yes  ?Psychological Evaluations: Yes  ?Past Medical History:  ?Past Medical History:  ?Diagnosis Date  ? Schizophrenia (HCC)   ? History reviewed. No pertinent surgical history. ?Family History: History reviewed. No pertinent family history. ?Family Psychiatric  History: There is no known family psychiatric history ?Tobacco Screening:   ?Social History:  ?Social History  ? ?Substance and Sexual Activity  ?Alcohol Use Yes  ?   ?Social History  ? ?Substance and Sexual Activity  ?Drug Use Not Currently  ? Types: Marijuana  ?  ?Additional Social History: ?  ?   ?  ?  ?  ?  ?  ?  ?  ?  ?  ?  ? ?Allergies:  No Known Allergies ?Lab Results:  ?Results for orders placed or performed during the hospital encounter of 09/15/21 (from the past 48 hour(s))  ?Comprehensive metabolic panel     Status: Abnormal  ? Collection Time: 09/15/21 10:34 PM  ?Result Value Ref Range  ? Sodium 140 135 - 145 mmol/L  ? Potassium 3.0 (L) 3.5 - 5.1 mmol/L  ? Chloride 103 98 - 111 mmol/L  ? CO2 27 22 - 32 mmol/L  ? Glucose, Bld 91 70 - 99 mg/dL  ?  Comment: Glucose reference range applies only to samples taken after fasting for at least 8 hours.  ? BUN 13 6 - 20 mg/dL  ? Creatinine, Ser 1.23 0.61 - 1.24 mg/dL  ? Calcium 8.9 8.9 - 10.3 mg/dL  ? Total Protein 8.1 6.5 - 8.1 g/dL  ? Albumin 4.4 3.5 - 5.0 g/dL  ? AST 17 15 - 41 U/L  ? ALT 9 0 - 44 U/L  ? Alkaline Phosphatase 57 38 - 126 U/L  ? Total  Bilirubin 0.9 0.3 - 1.2 mg/dL  ? GFR, Estimated >60 >60 mL/min  ?  Comment: (NOTE) ?Calculated using the CKD-EPI Creatinine Equation (2021) ?  ? Anion gap 10 5 - 15  ?  Comment: Performed at [facility], [address], [place], [place] [address]  ?Ethanol     Status: None  ? Collection Time: 09/15/21 10:34 PM  ?Result Value Ref Range  ? Alcohol, Ethyl (B) <10 <10 mg/dL  ?  Comment: (NOTE) ?Lowest detectable limit for serum alcohol is 10 mg/dL. ? ?For medical purposes only. ?Performed at [facility], [address] [place], ?[place] [address] ?  ?Salicylate level  Status: Abnormal  ? Collection Time: 09/15/21 10:34 PM  ?Result Value Ref Range  ? Salicylate Lvl <7.0 (L) 7.0 - 30.0 mg/dL  ?  Comment: Performed at [facility], [address] [place], [place] [address]  ?Acetaminophen level     Status: Abnormal  ? Collection Time: 09/15/21 10:34 PM  ?Result Value Ref Range  ? Acetaminophen (Tylenol), Serum <10 (L) 10 - 30 ug/mL  ?  Comment: (NOTE) ?Therapeutic concentrations vary significantly. A range of 10-30 ug/mL  ?may be an effective concentration for many patients. However, some  ?are best treated at concentrations outside of this range. ?Acetaminophen concentrations >150 ug/mL at 4 hours after ingestion  ?and >50 ug/mL at 12 hours after ingestion are often associated with  ?toxic reactions. ? ?Performed at [facility], [address] [place], ?[place] [address] ?  ?cbc     Status: Abnormal  ? Collection Time: 09/15/21 10:34 PM  ?Result Value Ref Range  ? WBC 9.0 4.0 - 10.5 K/uL  ? RBC 5.29 4.22 - 5.81 MIL/uL  ? Hemoglobin 12.4 (L) 13.0 - 17.0 g/dL  ? HCT 40.5 39.0 - 52.0 %  ? MCV 76.6 (L) 80.0 - 100.0 fL  ? MCH 23.4 (L) 26.0 - 34.0 pg  ? MCHC 30.6 30.0 - 36.0 g/dL  ? RDW 15.9 (H) 11.5 - 15.5 %  ? Platelets 341 150 - 400 K/uL  ? nRBC 0.0 0.0 - 0.2 %  ?  Comment: Performed at [facility], [address] [place], [place] [address]  ?Resp Panel by RT-PCR  (Flu A&B, Covid) Nasopharyngeal Swab     Status: None  ? Collection Time: 09/16/21 12:29 AM  ? Specimen: Nasopharyngeal Swab; Nasopharyngeal(NP) swabs in vial transport medium  ?Result Value Ref Range  ? SARS

## 2021-09-16 NOTE — ED Notes (Signed)
Breakfast tray given. No other needs found at this moment.  

## 2021-09-16 NOTE — ED Provider Notes (Signed)
Emergency Medicine Observation Re-evaluation Note ? ?Nicholas Barron is a 26 y.o. male, seen on rounds today.  Pt initially presented to the ED for complaints of Suicidal and Paranoid ?Currently, the patient is resting comfortably. ? ?Physical Exam  ?BP 133/86   Pulse 95   Temp 98.7 ?F (37.1 ?C) (Oral)   Resp 16   Wt 117.9 kg   SpO2 97%   BMI 39.52 kg/m?  ?Physical Exam ?Constitutional:   ?   Appearance: He is not ill-appearing or toxic-appearing.  ?Cardiovascular:  ?   Comments: Appears well perfused ?Pulmonary:  ?   Effort: Pulmonary effort is normal.  ?Musculoskeletal:     ?   General: No deformity.  ?Neurological:  ?   General: No focal deficit present.  ?Psychiatric:  ?   Comments: No emotional distress  ? ? ? ?ED Course / MDM  ?EKG:  ? ?I have reviewed the labs performed to date as well as medications administered while in observation.  Recent changes in the last 24 hours include seen by psych NP who recommends inpatient stay. ? ?Plan  ?Current plan is for admit. ? Aniello Amrhein is not under involuntary commitment. ? ? ?  ?Delton Prairie, MD ?09/16/21 [phone] ? ?

## 2021-09-16 NOTE — Progress Notes (Signed)
Patient ID: Nicholas Barron, male   DOB: 02-09-1996, 26 y.o.   MRN: [id number] ?Admission note: ?Patient is a 26 year old male, presents voluntary per report of schizoaffective disorder . Patient is alert and oriented to unit. Patients affect is flat. Patient is cooperative during assessment questions. Patient states the reason for his admission is because he was hallucinating and had thoughts of wanting to harm himself. Patient presents with bilateral arm/forearm tattoos and a birthmark to the left lower back.  ?Patient denies stressors related to admission at this time. Patient endorses auditory and visual hallucinations stating the voices are saying "too much to repeat back". ?Patient currently denies SI/HI at time of admission assessment. ?Unit policies explained and verbalized understanding. Q15 minute checks maintained and will continue to monitor.   ?

## 2021-09-16 NOTE — ED Notes (Signed)
Pt. Transferred to BHU from ED to room after screening for contraband. Report to include Situation, Background, Assessment and Recommendations from [facility]. Pt. Oriented to unit including Q15 minute rounds as well as the security cameras for their protection. Patient is alert and oriented, warm and dry in no acute distress. Patient denies SI and HI. He reported AVH. Pt. Encouraged to let me know if needs arise. ? ?

## 2021-09-17 DIAGNOSIS — F251 Schizoaffective disorder, depressive type: Secondary | ICD-10-CM | POA: Diagnosis not present

## 2021-09-17 LAB — LIPID PANEL
Cholesterol: 144 mg/dL (ref 0–200)
HDL: 30 mg/dL — ABNORMAL LOW (ref >40)
LDL Cholesterol: 105 mg/dL — ABNORMAL HIGH (ref 0–99)
Total CHOL/HDL Ratio: 4.8 RATIO
Triglycerides: 47 mg/dL (ref <150)
VLDL: 9 mg/dL (ref 0–40)

## 2021-09-17 LAB — HEMOGLOBIN A1C
Hgb A1c MFr Bld: 5.2 % (ref 4.8–5.6)
Mean Plasma Glucose: 102.54 mg/dL

## 2021-09-17 NOTE — Group Note (Signed)
[facility] LCSW Group Therapy Note ? ? ?Group Date: 09/17/2021 ?Start Time: 1300 ?End Time: 1400 ? ?Type of Therapy and Topic:  Group Therapy:  Feelings around Relapse and Recovery ? ?Participation Level:  Minimal  ? ?Description of Group:   ? Patients in this group will discuss emotions they experience before and after a relapse. They will process how experiencing these feelings, or avoidance of experiencing them, relates to having a relapse. Facilitator will guide patients to explore emotions they have related to recovery. Patients will be encouraged to process which emotions are more powerful. They will be guided to discuss the emotional reaction significant others in their lives may have to patients? relapse or recovery. Patients will be assisted in exploring ways to respond to the emotions of others without this contributing to a relapse. ? ?Therapeutic Goals: ?Patient will identify two or more emotions that lead to relapse for them:  ?Patient will identify two emotions that result when they relapse:  ?Patient will identify two emotions related to recovery:  ?Patient will demonstrate ability to communicate their needs through discussion and/or role plays. ? ? ?Summary of Patient Progress: ? ?Patient was present for the entirety of group session. Patient participated in opening and closing remarks. However, patient did not contribute at all to the topic of discussion despite encouraged participation.   ? ? ?Therapeutic Modalities:   ?Cognitive Behavioral Therapy ?Solution-Focused Therapy ?Assertiveness Training ?Relapse Prevention Therapy ? ? ?Durenda Hurt, LCSWA ?

## 2021-09-17 NOTE — Progress Notes (Addendum)
[facility] LCSW Progress Note ?  ?09/17/2021    4:05 PM ? ?Nicholas Barron  ? ?[id number]  ? ?Type of Contact and Topic:  PSA Attempt  ? ?CSW attempted to complete PSA w/ patient who is currently unable. Patient is not a good historian and has disorganized thought/behaviors. CSW to make additional attempts at completed PSA.  ? ?  ?Signed:  ?Corky Crafts, MSW, LCSWA, LCAS ?09/17/2021 4:05 PM ?  ?  ?

## 2021-09-17 NOTE — Progress Notes (Signed)
Recreation Therapy Notes ? ?INPATIENT RECREATION TR PLAN ? ?Patient Details ?Name: Nicholas Barron ?MRN: [id number] ?DOB: 02-06-1996 ?Today's Date: 09/17/2021 ? ?Rec Therapy Plan ?Is patient appropriate for Therapeutic Recreation?: Yes ?Treatment times per week: At least 3 ?Estimated Length of Stay: 5-7days ?TR Treatment/Interventions: Group participation (Comment) ? ?Discharge Criteria ?Pt will be discharged from therapy if:: Discharged ?Treatment plan/goals/alternatives discussed and agreed upon by:: Patient/family ? ?Discharge Summary ?  ? ? ?Nicholas Barron ?09/17/2021, 3:42 PM ?

## 2021-09-17 NOTE — Progress Notes (Signed)
Recreation Therapy Notes ? ? ?Date: 09/17/2021 ? ?Time: 10:40 am   ? ?Location: Craft room   ? ?Behavioral response: Appropriate ? ?Intervention Topic: Relaxation   ? ?Discussion/Intervention:  ?Group content today was focused on relaxation. The group defined relaxation and identified healthy ways to relax. Individuals expressed how much time they spend relaxing. Patients expressed how much their life would be if they did not make time for themselves to relax. The group stated ways they could improve their relaxation techniques in the future.  Individuals participated in the intervention ?Time to Relax? where they had a chance to experience different relaxation techniques.  ?Clinical Observations/Feedback: ?Patient came to group late due to unknown reasons. Individual was social with peers and staff while participating in the intervention.    ?Nicholas Barron LRT/CTRS  ? ? ? ? ? ? ? ?Nicholas Barron ?09/17/2021 12:45 PM ?

## 2021-09-17 NOTE — Progress Notes (Signed)
[facility] MD Progress Note ? ?09/17/2021 4:13 PM ?Nicholas Barron  ?MRN:  [id number] ?Subjective: Patient seen and chart reviewed.  Patient attended treatment team this morning as well.  This afternoon he tells me he is feeling a little bit better.  He is less anxious than he was yesterday.  He denies having any current suicidal thoughts.  His affect is still blunted but he is taking care of his ADLs and he is out in the day room interacting appropriately with patients and staff.  Tolerating medicine well. ?Principal Problem: Schizoaffective disorder (HCC) ?Diagnosis: Principal Problem: ?  Schizoaffective disorder (HCC) ? ?Total Time spent with patient: 30 minutes ? ?Past Psychiatric History: Past history of schizophrenic type illness ? ?Past Medical History:  ?Past Medical History:  ?Diagnosis Date  ? Schizophrenia (HCC)   ? History reviewed. No pertinent surgical history. ?Family History: History reviewed. No pertinent family history. ?Family Psychiatric  History: See previous ?Social History:  ?Social History  ? ?Substance and Sexual Activity  ?Alcohol Use Yes  ?   ?Social History  ? ?Substance and Sexual Activity  ?Drug Use Not Currently  ? Types: Marijuana  ?  ?Social History  ? ?Socioeconomic History  ? Marital status: Single  ?  Spouse name: Not on file  ? Number of children: Not on file  ? Years of education: Not on file  ? Highest education level: Not on file  ?Occupational History  ? Not on file  ?Tobacco Use  ? Smoking status: Former  ? Smokeless tobacco: Never  ?Substance and Sexual Activity  ? Alcohol use: Yes  ? Drug use: Not Currently  ?  Types: Marijuana  ? Sexual activity: Yes  ?Other Topics Concern  ? Not on file  ?Social History Narrative  ? Not on file  ? ?Social Determinants of Health  ? ?Financial Resource Strain: Not on file  ?Food Insecurity: Not on file  ?Transportation Needs: Not on file  ?Physical Activity: Not on file  ?Stress: Not on file  ?Social Connections: Not on file  ? ?Additional Social  History:  ?  ?  ?  ?  ?  ?  ?  ?  ?  ?  ?  ? ?Sleep: Fair ? ?Appetite:  Fair ? ?Current Medications: ?Current Facility-Administered Medications  ?Medication Dose Route Frequency Provider Last Rate Last Admin  ? acetaminophen (TYLENOL) tablet 650 mg  650 mg Oral Q6H PRN Vanetta Mulders, NP      ? alum & mag hydroxide-simeth (MAALOX/MYLANTA) 200-200-20 MG/5ML suspension 30 mL  30 mL Oral Q4H PRN Gabriel Cirri F, NP      ? benztropine (COGENTIN) tablet 0.5 mg  0.5 mg Oral BID Gabriel Cirri F, NP   0.5 mg at 09/17/21 6269  ? haloperidol (HALDOL) tablet 5 mg  5 mg Oral BID Gabriel Cirri F, NP   5 mg at 09/17/21 4854  ? hydrOXYzine (ATARAX) tablet 50 mg  50 mg Oral Q6H PRN Shadee Rathod, Jackquline Denmark, MD   50 mg at 09/17/21 0831  ? magnesium hydroxide (MILK OF MAGNESIA) suspension 30 mL  30 mL Oral Daily PRN Vanetta Mulders, NP      ? traZODone (DESYREL) tablet 100 mg  100 mg Oral QHS PRN Creedence Heiss, Jackquline Denmark, MD      ? ? ?Lab Results:  ?Results for orders placed or performed during the hospital encounter of 09/16/21 (from the past 48 hour(s))  ?Lipid panel     Status: Abnormal  ? Collection Time:  09/17/21  6:59 AM  ?Result Value Ref Range  ? Cholesterol 144 0 - 200 mg/dL  ? Triglycerides 47 <150 mg/dL  ? HDL 30 (L) >40 mg/dL  ? Total CHOL/HDL Ratio 4.8 RATIO  ? VLDL 9 0 - 40 mg/dL  ? LDL Cholesterol 105 (H) 0 - 99 mg/dL  ?  Comment:        ?Total Cholesterol/HDL:CHD Risk ?Coronary Heart Disease Risk Table ?                    Men   Women ? 1/2 Average Risk   3.4   3.3 ? Average Risk       5.0   4.4 ? 2 X Average Risk   9.6   7.1 ? 3 X Average Risk  23.4   11.0 ?       ?Use the calculated Patient Ratio ?above and the CHD Risk Table ?to determine the patient's CHD Risk. ?       ?ATP III CLASSIFICATION (LDL): ? <100     mg/dL   Optimal ? 650-354  mg/dL   Near or Above ?                   Optimal ? 130-159  mg/dL   Borderline ? 160-189  mg/dL   High ? >656     mg/dL   Very High ?Performed at [facility], [address], [place], [place] [address] ?  ?Hemoglobin A1c     Status: None  ? Collection Time: 09/17/21  6:59 AM  ?Result Value Ref Range  ? Hgb A1c MFr Bld 5.2 4.8 - 5.6 %  ?  Comment: (NOTE) ?Pre diabetes:          5.7%-6.4% ? ?Diabetes:              >6.4% ? ?Glycemic control for   <7.0% ?adults with diabetes ?  ? Mean Plasma Glucose 102.54 mg/dL  ?  Comment: Performed at [facility], [address], [place], [place] [address]  ? ? ?Blood Alcohol level:  ?Lab Results  ?Component Value Date  ? ETH <10 09/15/2021  ? ETH <10 04/06/2020  ? ? ?Metabolic Disorder Labs: ?Lab Results  ?Component Value Date  ? HGBA1C 5.2 09/17/2021  ? MPG 102.54 09/17/2021  ? MPG 102.54 09/15/2021  ? ?No results found for: PROLACTIN ?Lab Results  ?Component Value Date  ? CHOL 144 09/17/2021  ? TRIG 47 09/17/2021  ? HDL 30 (L) 09/17/2021  ? CHOLHDL 4.8 09/17/2021  ? VLDL 9 09/17/2021  ? LDLCALC 105 (H) 09/17/2021  ? LDLCALC 118 (H) 03/31/2020  ? ? ?Physical Findings: ?AIMS:  , ,  ,  ,    ?CIWA:    ?COWS:    ? ?Musculoskeletal: ?Strength & Muscle Tone: within normal limits ?Gait & Station: normal ?Patient leans: N/A ? ?Psychiatric Specialty Exam: ? ?Presentation  ?General Appearance: Appropriate for Environment ? ?Eye Contact:Good ? ?Speech:Clear and Coherent ? ?Speech Volume:Decreased ? ?Handedness:Right ? ? ?Mood and Affect  ?Mood:Euthymic ? ?Affect:Blunt; Congruent ? ? ?Thought Process  ?Thought Processes:Coherent ? ?Descriptions of Associations:Intact ? ?Orientation:Full (Time, Place and Person) ? ?Thought Content:Logical ? ?History of Schizophrenia/Schizoaffective disorder:Yes ? ?Duration of Psychotic Symptoms:Greater than six months ? ?Hallucinations:No data recorded ?Ideas of Reference:None ? ?Suicidal Thoughts:No data recorded ?Homicidal Thoughts:No data recorded ? ?Sensorium  ?Memory:Immediate Fair; Recent Fair; Remote Fair ? ?Judgment:Fair ? ?Insight:Fair ? ? ?Executive Functions  ?Concentration:Fair ? ?  Attention  Span:Fair ? ?Recall:Fair ? ?Fund of Knowledge:Fair ? ?Language:Fair ? ? ?Psychomotor Activity  ?Psychomotor Activity:No data recorded ? ?Assets  ?Assets:Communication Skills; Resilience; Social Support ? ? ?Sleep  ?Sleep:No data recorded ? ? ?Physical Exam: ?Physical Exam ?Vitals and nursing note reviewed.  ?Constitutional:   ?   Appearance: Normal appearance.  ?HENT:  ?   Head: Normocephalic and atraumatic.  ?   Mouth/Throat:  ?   Pharynx: Oropharynx is clear.  ?Eyes:  ?   Pupils: Pupils are equal, round, and reactive to light.  ?Cardiovascular:  ?   Rate and Rhythm: Normal rate and regular rhythm.  ?Pulmonary:  ?   Effort: Pulmonary effort is normal.  ?   Breath sounds: Normal breath sounds.  ?Abdominal:  ?   General: Abdomen is flat.  ?   Palpations: Abdomen is soft.  ?Musculoskeletal:     ?   General: Normal range of motion.  ?Skin: ?   General: Skin is warm and dry.  ?Neurological:  ?   General: No focal deficit present.  ?   Mental Status: He is alert. Mental status is at baseline.  ?Psychiatric:     ?   Mood and Affect: Mood normal.     ?   Thought Content: Thought content normal.  ? ?Review of Systems  ?Constitutional: Negative.   ?HENT: Negative.    ?Eyes: Negative.   ?Respiratory: Negative.    ?Cardiovascular: Negative.   ?Gastrointestinal: Negative.   ?Musculoskeletal: Negative.   ?Skin: Negative.   ?Neurological: Negative.   ?Psychiatric/Behavioral:  Positive for depression. Negative for hallucinations, substance abuse and suicidal ideas. The patient is not nervous/anxious and does not have insomnia.   ?Blood pressure 116/80, pulse 68, temperature 97.9 ?F (36.6 ?C), temperature source Oral, resp. rate 18, height 5' 10.47" (1.79 m), weight 103.9 kg, SpO2 99 %. Body mass index is 32.42 kg/m?. ? ? ?Treatment Plan Summary: ?Medication management and Plan no change to medication management.  Supportive counseling.  Reminded patient that it does take some time for medications to become more effective.  He is  fine with that.  Not going to change any dosages of anything today.  Hopefully he will be stable over the next couple days and we can make sure he is good outpatient treatment ? ?Nicholas RasmussenJohn Jarelis Ehlert, MD ?09/17/2021, 4:13 PM ? ?

## 2021-09-17 NOTE — Progress Notes (Signed)
Recreation Therapy Notes ? ?INPATIENT RECREATION THERAPY ASSESSMENT ? ?Patient Details ?Name: Nicholas Barron ?MRN: [id number] ?DOB: 1995/07/17 ?Today's Date: 09/17/2021 ?      ?Information Obtained From: ?Patient ? ?Able to Participate in Assessment/Interview: ?Yes ? ?Patient Presentation: ?Responsive ? ?Reason for Admission (Per Patient): ?Active Symptoms ? ?Patient Stressors: ?  ? ?Coping Skills:   ?Isolation, Avoidance ? ?Leisure Interests (2+):  ?[employer] - Fishing ? ?Frequency of Recreation/Participation: ?Monthly ? ?Awareness of Community Resources:  ?Yes ? ?Community Resources:  ?Park ? ?Current Use: ?Yes ? ?If no, Barriers?: ?  ? ?Expressed Interest in [employer] Information: ?  ? ?[place] of Residence:  ?[place] ? ?Patient Main Form of Transportation: ?Car ? ?Patient Strengths:  ?So many ? ?Patient Identified Areas of Improvement:  ?Nothing ? ?Patient Goal for Hospitalization:  ?Make the best out of it ? ?Current SI (including self-harm):  ?No ? ?Current HI:  ?No ? ?Current AVH: ?No ? ?Staff Intervention Plan: ?Group Attendance, Collaborate with Interdisciplinary Treatment Team ? ?Consent to Intern Participation: ?N/A ? ?Will Schier ?09/17/2021, 3:38 PM ?

## 2021-09-17 NOTE — Plan of Care (Signed)
D: Pt alert and oriented. Pt rates depression 8/10, hopelessness 9/10, and anxiety 10/10. Pt reports energy level as normal and concentration as being poor. Pt reports sleep last night as being poor. Pt did not receive medications for sleep. Pt denies experiencing any pain at this time. Pt denies experiencing any SI/HI, or AVH at this time.  ? ?Pt was observed active in the milieu interacting with others. Pt attended groups. Pt has been calm and cooperative. Pt endorsed experiencing anxiety this morning and found prn medication helpful. ? ?A: Scheduled medications administered to pt, per MD orders. Support and encouragement provided. Frequent verbal contact made. Routine safety checks conducted q15 minutes.  ? ?R: No adverse drug reactions noted. Pt verbally contracts for safety at this time. Pt compliant with medications and treatment plan. Pt interacts minimally with others on the unit. Pt remains safe at this time. Will continue to monitor.  ?Problem: Education: ?Goal: Emotional status will improve ?Outcome: Not Progressing ?Goal: Mental status will improve ?Outcome: Not Progressing ?  ?

## 2021-09-17 NOTE — BH IP Treatment Plan (Signed)
Interdisciplinary Treatment and Diagnostic Plan Update ? ?09/17/2021 ?Time of Session: 1000 ?Nicholas Barron ?MRN: [id number] ? ?Principal Diagnosis: Schizoaffective disorder ([place]) ? ?Secondary Diagnoses: Principal Problem: ?  Schizoaffective disorder ([place]) ? ? ?Current Medications:  ?Current Facility-Administered Medications  ?Medication Dose Route Frequency Provider Last Rate Last Admin  ? acetaminophen (TYLENOL) tablet 650 mg  650 mg Oral Q6H PRN Sherlon Handing, NP      ? alum & mag hydroxide-simeth (MAALOX/MYLANTA) 200-200-20 MG/5ML suspension 30 mL  30 mL Oral Q4H PRN Waldon Merl F, NP      ? benztropine (COGENTIN) tablet 0.5 mg  0.5 mg Oral BID Waldon Merl F, NP   0.5 mg at 09/17/21 3149  ? haloperidol (HALDOL) tablet 5 mg  5 mg Oral BID Waldon Merl F, NP   5 mg at 09/17/21 7026  ? hydrOXYzine (ATARAX) tablet 50 mg  50 mg Oral Q6H PRN Clapacs, Madie Reno, MD   50 mg at 09/17/21 0831  ? magnesium hydroxide (MILK OF MAGNESIA) suspension 30 mL  30 mL Oral Daily PRN Waldon Merl F, NP      ? traZODone (DESYREL) tablet 100 mg  100 mg Oral QHS PRN Clapacs, Madie Reno, MD      ? ?PTA Medications: ?Medications Prior to Admission  ?Medication Sig Dispense Refill Last Dose  ? benztropine (COGENTIN) 0.5 MG tablet Take 1 tablet (0.5 mg total) by mouth 2 (two) times daily. (Patient not taking: Reported on 09/15/2021) 60 tablet 1   ? haloperidol (HALDOL) 5 MG tablet TAKE 1 TABLET BY MOUTH TWO TIMES DAILY 28 tablet 0   ? haloperidol decanoate (HALDOL DECANOATE) 100 MG/ML injection Inject 1 mL (100 mg total) into the muscle every 30 (thirty) days. (Patient not taking: Reported on 09/15/2021) 1 mL 3   ? ondansetron (ZOFRAN ODT) 4 MG disintegrating tablet Take 1 tablet (4 mg total) by mouth every 8 (eight) hours as needed for nausea or vomiting. (Patient not taking: Reported on 09/15/2021) 20 tablet 0   ? ? ?Patient Stressors: Other: patient did not identify    ? ?Patient Strengths: Physical Health  ? ?Treatment  Modalities: Medication Management, Group therapy, Case management,  ?1 to 1 session with clinician, Psychoeducation, Recreational therapy. ? ? ?Physician Treatment Plan for Primary Diagnosis: Schizoaffective disorder ([place]) ?Long Term Goal(s): Improvement in symptoms so as ready for discharge  ? ?Short Term Goals: Compliance with prescribed medications will improve ?Ability to identify triggers associated with substance abuse/mental health issues will improve ?Ability to verbalize feelings will improve ?Ability to disclose and discuss suicidal ideas ?Ability to demonstrate self-control will improve ? ?Medication Management: Evaluate patient's response, side effects, and tolerance of medication regimen. ? ?Therapeutic Interventions: 1 to 1 sessions, Unit Group sessions and Medication administration. ? ?Evaluation of Outcomes: Not Met ? ?Physician Treatment Plan for Secondary Diagnosis: Principal Problem: ?  Schizoaffective disorder ([place]) ? ?Long Term Goal(s): Improvement in symptoms so as ready for discharge  ? ?Short Term Goals: Compliance with prescribed medications will improve ?Ability to identify triggers associated with substance abuse/mental health issues will improve ?Ability to verbalize feelings will improve ?Ability to disclose and discuss suicidal ideas ?Ability to demonstrate self-control will improve    ? ?Medication Management: Evaluate patient's response, side effects, and tolerance of medication regimen. ? ?Therapeutic Interventions: 1 to 1 sessions, Unit Group sessions and Medication administration. ? ?Evaluation of Outcomes: Not Met ? ? ?RN Treatment Plan for Primary Diagnosis: Schizoaffective disorder ([place]) ?Long Term Goal(s): Knowledge of disease and  therapeutic regimen to maintain health will improve ? ?Short Term Goals: Ability to remain free from injury will improve, Ability to verbalize frustration and anger appropriately will improve, Ability to demonstrate self-control, Ability to  participate in decision making will improve, Ability to verbalize feelings will improve, Ability to disclose and discuss suicidal ideas, Ability to identify and develop effective coping behaviors will improve, and Compliance with prescribed medications will improve ? ?Medication Management: RN will administer medications as ordered by provider, will assess and evaluate patient's response and provide education to patient for prescribed medication. RN will report any adverse and/or side effects to prescribing provider. ? ?Therapeutic Interventions: 1 on 1 counseling sessions, Psychoeducation, Medication administration, Evaluate responses to treatment, Monitor vital signs and CBGs as ordered, Perform/monitor CIWA, COWS, AIMS and Fall Risk screenings as ordered, Perform wound care treatments as ordered. ? ?Evaluation of Outcomes: Not Met ? ? ?LCSW Treatment Plan for Primary Diagnosis: Schizoaffective disorder ([place]) ?Long Term Goal(s): Safe transition to appropriate next level of care at discharge, Engage patient in therapeutic group addressing interpersonal concerns. ? ?Short Term Goals: Engage patient in aftercare planning with referrals and resources, Increase social support, Increase ability to appropriately verbalize feelings, Increase emotional regulation, Facilitate acceptance of mental health diagnosis and concerns, Facilitate patient progression through stages of change regarding substance use diagnoses and concerns, Identify triggers associated with mental health/substance abuse issues, and Increase skills for wellness and recovery ? ?Therapeutic Interventions: Assess for all discharge needs, 1 to 1 time with Education officer, museum, Explore available resources and support systems, Assess for adequacy in community support network, Educate family and significant other(s) on suicide prevention, Complete Psychosocial Assessment, Interpersonal group therapy. ? ?Evaluation of Outcomes: Not Met ? ? ?Progress in  Treatment: ?Attending groups: No. ?Participating in groups: No. ?Taking medication as prescribed: Yes. ?Toleration medication: Yes. ?Family/Significant other contact made: No, will contact:  CSW will reach out to collateral contact once provided consent ?Patient understands diagnosis: No. ?Discussing patient identified problems/goals with staff: Yes. ?Medical problems stabilized or resolved: Yes. ?Denies suicidal/homicidal ideation: No. ?Issues/concerns per patient self-inventory: Yes. ?Other: none  ? ?New problem(s) identified: No, Describe:  No additional problems/concerns at this time.  ? ?New Short Term/Long Term Goal(s): Patient to work towards detox, elimination of symptoms of psychosis, medication management for mood stabilization; elimination of SI thoughts; development of comprehensive mental wellness/sobriety plan. ? ?Patient Goals:  States "Get better so I can get out of here." ? ?Discharge Plan or Barriers: No psychosocial barriers identified at this time, patient to return to place of residence when appropriate for discharge.  ? ?Reason for Continuation of Hospitalization: Other; describe Psychosis  ? ?Estimated Length of Stay: ? ?Last 3 [place] Suicide Severity Risk Score: ?Flowsheet Row Admission (Current) from 09/16/2021 in [place] ED from 09/15/2021 in [place] ED from 07/05/2021 in [place]  ?C-SSRS RISK CATEGORY Low Risk Low Risk No Risk  ? ?  ? ? ?Last PHQ 2/9 Scores: ?   ? View : No data to display.  ?  ?  ?  ? ? ?Scribe for Treatment Team: ?Durenda Hurt, LCSWA ?09/17/2021 ?10:28 AM ? ? ?

## 2021-09-18 DIAGNOSIS — F251 Schizoaffective disorder, depressive type: Secondary | ICD-10-CM | POA: Diagnosis not present

## 2021-09-18 NOTE — Plan of Care (Signed)
?  Problem: Education: ?Goal: Knowledge of [facility] Education information/materials will improve ?Outcome: Progressing ?  ?Problem: Education: ?Goal: Knowledge of [facility] Education information/materials will improve ?Outcome: Progressing ?Goal: Emotional status will improve ?Outcome: Progressing ?Goal: Mental status will improve ?Outcome: Progressing ?Goal: Verbalization of understanding the information provided will improve ?Outcome: Progressing ?  ?Problem: Activity: ?Goal: Interest or engagement in activities will improve ?Outcome: Progressing ?Goal: Sleeping patterns will improve ?Outcome: Progressing ?  ?Problem: Coping: ?Goal: Ability to verbalize frustrations and anger appropriately will improve ?Outcome: Progressing ?Goal: Ability to demonstrate self-control will improve ?Outcome: Progressing ?  ?Problem: Health Behavior/Discharge Planning: ?Goal: Identification of resources available to assist in meeting health care needs will improve ?Outcome: Progressing ?Goal: Compliance with treatment plan for underlying cause of condition will improve ?Outcome: Progressing ?  ?Problem: Physical Regulation: ?Goal: Ability to maintain clinical measurements within normal limits will improve ?Outcome: Progressing ?  ?Problem: Safety: ?Goal: Periods of time without injury will increase ?Outcome: Progressing ?  ?Problem: Activity: ?Goal: Will verbalize the importance of balancing activity with adequate rest periods ?Outcome: Progressing ?  ?Problem: Education: ?Goal: Will be free of psychotic symptoms ?Outcome: Progressing ?Goal: Knowledge of the prescribed therapeutic regimen will improve ?Outcome: Progressing ?  ?Problem: Coping: ?Goal: Coping ability will improve ?Outcome: Progressing ?Goal: Will verbalize feelings ?Outcome: Progressing ?  ?Problem: Health Behavior/Discharge Planning: ?Goal: Compliance with prescribed medication regimen will improve ?Outcome: Progressing ?  ?Problem:  Nutritional: ?Goal: Ability to achieve adequate nutritional intake will improve ?Outcome: Progressing ?  ?Problem: Role Relationship: ?Goal: Ability to communicate needs accurately will improve ?Outcome: Progressing ?Goal: Ability to interact with others will improve ?Outcome: Progressing ?  ?Problem: Safety: ?Goal: Ability to redirect hostility and anger into socially appropriate behaviors will improve ?Outcome: Progressing ?Goal: Ability to remain free from injury will improve ?Outcome: Progressing ?  ?Problem: Self-Care: ?Goal: Ability to participate in self-care as condition permits will improve ?Outcome: Progressing ?  ?Problem: Self-Concept: ?Goal: Will verbalize positive feelings about self ?Outcome: Progressing ?  ?
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
## 2021-09-18 NOTE — Group Note (Signed)
BHH LCSW Group Therapy Note ? ? ?Group Date: 09/18/2021 ?Start Time: 1300 ?End Time: 1400 ? ? ?Type of Therapy and Topic: Group Therapy: Avoiding Self-Sabotaging and Enabling Behaviors ? ?Participation Level: Minimal ? ?Mood: Depressed ? ?Description of Group:  ?In this group, patients will learn how to identify obstacles, self-sabotaging and enabling behaviors, as well as: what are they, why do we do them and what needs these behaviors meet. Discuss unhealthy relationships and how to have positive healthy boundaries with those that sabotage and enable. Explore aspects of self-sabotage and enabling in yourself and how to limit these self-destructive behaviors in everyday life. ? ? ?Therapeutic Goals: ?1. Patient will identify one obstacle that relates to self-sabotage and enabling behaviors ?2. Patient will identify one personal self-sabotaging or enabling behavior they did prior to admission ?3. Patient will state a plan to change the above identified behavior ?4. Patient will demonstrate ability to communicate their needs through discussion and/or role play.  ? ? ?Summary of Patient Progress:  Patient was present for the duration of group and participated in the icebreaker activity. Patient identified his favorite memory as "before I was born" with a flat, depressed affect. Patient was observed with his eyes closed, appearing drowsy throughout most of the session. Patient declined to participate in the closing remarks.  ? ? ? ?Therapeutic Modalities:  ?Cognitive Behavioral Therapy ?Person-Centered Therapy ?Motivational Interviewing ? ? ? ?Ileana Ladd Jolynne Spurgin, LCSWA ?

## 2021-09-18 NOTE — BHH Suicide Risk Assessment (Signed)
BHH INPATIENT:  Family/Significant Other Suicide Prevention Education ? ?Suicide Prevention Education:  ?Contact Attempts: Pecola Leisure, mother, [phone] has been identified by the patient as the family member/significant other with whom the patient will be residing, and identified as the person(s) who will aid the patient in the event of a mental health crisis.  With written consent from the patient, two attempts were made to provide suicide prevention education, prior to and/or following the patient's discharge.  We were unsuccessful in providing suicide prevention education.  A suicide education pamphlet was given to the patient to share with family/significant other. ? ?Date and time of first attempt:  09/18/2021 4:58PM CSW placed call to [facility] using emergency contact information in patient's chart [phone]) with written consent from patient. Incorrect phone number.  ? ?Date and time of second attempt: CSW will attempt to acquire correct phone number and make additional attempts. ? ?Norberto Sorenson ?09/18/2021, 4:56 PM ?

## 2021-09-18 NOTE — Progress Notes (Signed)
Pleasant and cooperative. Denies Si, HI and aVH ?

## 2021-09-18 NOTE — Plan of Care (Signed)
D: Pt alert and oriented. Pt reports experiencing anxiety and denies experiencing any depression at this time. Pt denies experiencing any pain at this time. Pt denies experiencing any SI/HI, or AVH at this time.  ? ?Pt can be observed active on the milieu. ? ?A: Scheduled medications administered to pt, per MD orders. Support and encouragement provided. Frequent verbal contact made. Routine safety checks conducted q15 minutes.  ? ?R: No adverse drug reactions noted. Pt verbally contracts for safety at this time. Pt compliant with medications and treatment plan. Pt interacts well with others on the unit. Pt remains safe at this time. Will continue to monitor.  ? ?Problem: Education: ?Goal: Knowledge of [facility] Education information/materials will improve ?Outcome: Progressing ?  ?Problem: Activity: ?Goal: Interest or engagement in activities will improve ?Outcome: Progressing ?  ?

## 2021-09-18 NOTE — Progress Notes (Signed)
[facility] MD Progress Note ? ?09/18/2021 11:29 AM ?Nicholas Barron  ?MRN:  [id number] ?Subjective: Follow-up for this patient with recurrent psychotic and mood symptoms.  Patient seen and chart reviewed.  Patient was a little more relaxed today but continues to be somewhat difficult to form any rapport with.  Asked him about suicidal thoughts and he gave somewhat noncommittal answers talking about how death was inevitable but not saying he was thinking of hurting himself.  Affect still seems flat and distant.  No behavior problems.  Tolerating medicine okay.  Does not necessarily seem to be responding to internal stimuli but does seem to be in his own world. ?Principal Problem: Schizoaffective disorder (HCC) ?Diagnosis: Principal Problem: ?  Schizoaffective disorder (HCC) ? ?Total Time spent with patient: 30 minutes ? ?Past Psychiatric History: Past history of psychotic disorder with mood symptoms ? ?Past Medical History:  ?Past Medical History:  ?Diagnosis Date  ? Schizophrenia (HCC)   ? History reviewed. No pertinent surgical history. ?Family History: History reviewed. No pertinent family history. ?Family Psychiatric  History: See previous ?Social History:  ?Social History  ? ?Substance and Sexual Activity  ?Alcohol Use Yes  ?   ?Social History  ? ?Substance and Sexual Activity  ?Drug Use Not Currently  ? Types: Marijuana  ?  ?Social History  ? ?Socioeconomic History  ? Marital status: Single  ?  Spouse name: Not on file  ? Number of children: Not on file  ? Years of education: Not on file  ? Highest education level: Not on file  ?Occupational History  ? Not on file  ?Tobacco Use  ? Smoking status: Former  ? Smokeless tobacco: Never  ?Substance and Sexual Activity  ? Alcohol use: Yes  ? Drug use: Not Currently  ?  Types: Marijuana  ? Sexual activity: Yes  ?Other Topics Concern  ? Not on file  ?Social History Narrative  ? Not on file  ? ?Social Determinants of Health  ? ?Financial Resource Strain: Not on file  ?Food  Insecurity: Not on file  ?Transportation Needs: Not on file  ?Physical Activity: Not on file  ?Stress: Not on file  ?Social Connections: Not on file  ? ?Additional Social History:  ?  ?  ?  ?  ?  ?  ?  ?  ?  ?  ?  ? ?Sleep: Fair ? ?Appetite:  Fair ? ?Current Medications: ?Current Facility-Administered Medications  ?Medication Dose Route Frequency Provider Last Rate Last Admin  ? acetaminophen (TYLENOL) tablet 650 mg  650 mg Oral Q6H PRN Vanetta MuldersBarthold, Louise F, NP      ? alum & mag hydroxide-simeth (MAALOX/MYLANTA) 200-200-20 MG/5ML suspension 30 mL  30 mL Oral Q4H PRN Gabriel CirriBarthold, Louise F, NP      ? benztropine (COGENTIN) tablet 0.5 mg  0.5 mg Oral BID Gabriel CirriBarthold, Louise F, NP   0.5 mg at 09/18/21 [id number]  ? haloperidol (HALDOL) tablet 5 mg  5 mg Oral BID Gabriel CirriBarthold, Louise F, NP   5 mg at 09/18/21 [id number]  ? hydrOXYzine (ATARAX) tablet 50 mg  50 mg Oral Q6H PRN Keijuan Schellhase, Jackquline DenmarkJohn T, MD   50 mg at 09/17/21 0831  ? magnesium hydroxide (MILK OF MAGNESIA) suspension 30 mL  30 mL Oral Daily PRN Gabriel CirriBarthold, Louise F, NP      ? traZODone (DESYREL) tablet 100 mg  100 mg Oral QHS PRN Brodyn Depuy, Jackquline DenmarkJohn T, MD      ? ? ?Lab Results:  ?Results for orders placed or performed during  the hospital encounter of 09/16/21 (from the past 48 hour(s))  ?Lipid panel     Status: Abnormal  ? Collection Time: 09/17/21  6:59 AM  ?Result Value Ref Range  ? Cholesterol 144 0 - 200 mg/dL  ? Triglycerides 47 <150 mg/dL  ? HDL 30 (L) >40 mg/dL  ? Total CHOL/HDL Ratio 4.8 RATIO  ? VLDL 9 0 - 40 mg/dL  ? LDL Cholesterol 105 (H) 0 - 99 mg/dL  ?  Comment:        ?Total Cholesterol/HDL:CHD Risk ?Coronary Heart Disease Risk Table ?                    Men   Women ? 1/2 Average Risk   3.4   3.3 ? Average Risk       5.0   4.4 ? 2 X Average Risk   9.6   7.1 ? 3 X Average Risk  23.4   11.0 ?       ?Use the calculated Patient Ratio ?above and the CHD Risk Table ?to determine the patient's CHD Risk. ?       ?ATP III CLASSIFICATION (LDL): ? <100     mg/dL   Optimal ? 378-588  mg/dL    Near or Above ?                   Optimal ? 130-159  mg/dL   Borderline ? 160-189  mg/dL   High ? >502     mg/dL   Very High ?Performed at [facility], [address], [place], [place] [address] ?  ?Hemoglobin A1c     Status: None  ? Collection Time: 09/17/21  6:59 AM  ?Result Value Ref Range  ? Hgb A1c MFr Bld 5.2 4.8 - 5.6 %  ?  Comment: (NOTE) ?Pre diabetes:          5.7%-6.4% ? ?Diabetes:              >6.4% ? ?Glycemic control for   <7.0% ?adults with diabetes ?  ? Mean Plasma Glucose 102.54 mg/dL  ?  Comment: Performed at [facility], [address], [place], [place] [address]  ? ? ?Blood Alcohol level:  ?Lab Results  ?Component Value Date  ? ETH <10 09/15/2021  ? ETH <10 04/06/2020  ? ? ?Metabolic Disorder Labs: ?Lab Results  ?Component Value Date  ? HGBA1C 5.2 09/17/2021  ? MPG 102.54 09/17/2021  ? MPG 102.54 09/15/2021  ? ?No results found for: PROLACTIN ?Lab Results  ?Component Value Date  ? CHOL 144 09/17/2021  ? TRIG 47 09/17/2021  ? HDL 30 (L) 09/17/2021  ? CHOLHDL 4.8 09/17/2021  ? VLDL 9 09/17/2021  ? LDLCALC 105 (H) 09/17/2021  ? LDLCALC 118 (H) 03/31/2020  ? ? ?Physical Findings: ?AIMS:  , ,  ,  ,    ?CIWA:    ?COWS:    ? ?Musculoskeletal: ?Strength & Muscle Tone: within normal limits ?Gait & Station: normal ?Patient leans: N/A ? ?Psychiatric Specialty Exam: ? ?Presentation  ?General Appearance: Appropriate for Environment ? ?Eye Contact:Good ? ?Speech:Clear and Coherent ? ?Speech Volume:Decreased ? ?Handedness:Right ? ? ?Mood and Affect  ?Mood:Euthymic ? ?Affect:Blunt; Congruent ? ? ?Thought Process  ?Thought Processes:Coherent ? ?Descriptions of Associations:Intact ? ?Orientation:Full (Time, Place and Person) ? ?Thought Content:Logical ? ?History of Schizophrenia/Schizoaffective disorder:Yes ? ?Duration of Psychotic Symptoms:Greater than six months ? ?Hallucinations:No data recorded ?Ideas of Reference:None ? ?Suicidal Thoughts:No data recorded ?Homicidal  Thoughts:No data  recorded ? ?Sensorium  ?Memory:Immediate Fair; Recent Fair; Remote Fair ? ?Judgment:Fair ? ?Insight:Fair ? ? ?Executive Functions  ?Concentration:Fair ? ?Attention Span:Fair ? ?Recall:Fair ? ?Fund of Knowledge:Fair ? ?Language:Fair ? ? ?Psychomotor Activity  ?Psychomotor Activity:No data recorded ? ?Assets  ?Assets:Communication Skills; Resilience; Social Support ? ? ?Sleep  ?Sleep:No data recorded ? ? ?Physical Exam: ?Physical Exam ?Vitals and nursing note reviewed.  ?Constitutional:   ?   Appearance: Normal appearance.  ?HENT:  ?   Head: Normocephalic and atraumatic.  ?   Mouth/Throat:  ?   Pharynx: Oropharynx is clear.  ?Eyes:  ?   Pupils: Pupils are equal, round, and reactive to light.  ?Cardiovascular:  ?   Rate and Rhythm: Normal rate and regular rhythm.  ?Pulmonary:  ?   Effort: Pulmonary effort is normal.  ?   Breath sounds: Normal breath sounds.  ?Abdominal:  ?   General: Abdomen is flat.  ?   Palpations: Abdomen is soft.  ?Musculoskeletal:     ?   General: Normal range of motion.  ?Skin: ?   General: Skin is warm and dry.  ?Neurological:  ?   General: No focal deficit present.  ?   Mental Status: He is alert. Mental status is at baseline.  ?Psychiatric:     ?   Attention and Perception: Attention normal.     ?   Mood and Affect: Mood normal. Affect is blunt.     ?   Speech: He is noncommunicative. Speech is delayed.     ?   Behavior: Behavior is slowed.     ?   Thought Content: Thought content normal.  ? ?Review of Systems  ?Constitutional: Negative.   ?HENT: Negative.    ?Eyes: Negative.   ?Respiratory: Negative.    ?Cardiovascular: Negative.   ?Gastrointestinal: Negative.   ?Musculoskeletal: Negative.   ?Skin: Negative.   ?Neurological: Negative.   ?Psychiatric/Behavioral:  Negative for depression, hallucinations, substance abuse and suicidal ideas. The patient is nervous/anxious.   ?Blood pressure (!) 131/91, pulse 62, temperature (!) 97.5 ?F (36.4 ?C), resp. rate 18, height 5' 10.47" (1.79 m), weight  103.9 kg, SpO2 100 %. Body mass index is 32.42 kg/m?. ? ? ?Treatment Plan Summary: ?Medication management and Plan plan so far has been to go with the medicine that he was on last time which was really mainly just halop

## 2021-09-19 DIAGNOSIS — F251 Schizoaffective disorder, depressive type: Secondary | ICD-10-CM | POA: Diagnosis not present

## 2021-09-19 MED ORDER — HALOPERIDOL DECANOATE 100 MG/ML IM SOLN
100.0000 mg | INTRAMUSCULAR | Status: DC
  Administered 2021-09-19: 100 mg via INTRAMUSCULAR
  Filled 2021-09-19: qty 1

## 2021-09-19 NOTE — Progress Notes (Signed)
Patient has been pleasant and cooperative on shift. Patient denies SI,HI & AVH. ?

## 2021-09-19 NOTE — Plan of Care (Signed)
Met with PT in medication room. Pt is minimal. Denies SI / HI / AVH. Pt is adherent with scheduled medications. Pt is resting in bed throughout the shift. Assigned staff will continue Q 15 min Safety rounds. Pt has no signs of injury or distress.  ? ? ?Problem: Education: ?Goal: Knowledge of [facility] Education information/materials will improve ?Outcome: Progressing ?Goal: Emotional status will improve ?Outcome: Progressing ?Goal: Mental status will improve ?Outcome: Progressing ?  ? ? ?

## 2021-09-19 NOTE — Group Note (Signed)
LCSW Group Therapy Note ? ?Group Date: 09/19/2021 ?Start Time: 1310 ?End Time: 1400 ? ? ?Type of Therapy and Topic:  Group Therapy - Healthy vs Unhealthy Coping Skills ? ?Participation Level:  Minimal  ? ?Description of Group ?The focus of this group was to determine what unhealthy coping techniques typically are used by group members and what healthy coping techniques would be helpful in coping with various problems. Patients were guided in becoming aware of the differences between healthy and unhealthy coping techniques. Patients were asked to identify 2-3 healthy coping skills they would like to learn to use more effectively. ? ?Therapeutic Goals ?Patients learned that coping is what human beings do all day long to deal with various situations in their lives ?Patients defined and discussed healthy vs unhealthy coping techniques ?Patients identified their preferred coping techniques and identified whether these were healthy or unhealthy ?Patients determined 2-3 healthy coping skills they would like to become more familiar with and use more often. ?Patients provided support and ideas to each other ? ? ?Summary of Patient Progress: Patient presented on time for group and participated in the opening remarks. Patient was observed with his eyes closed the remainder of group and did not contribute to group discussion. ? ? ?Therapeutic Modalities ?Cognitive Behavioral Therapy ?Motivational Interviewing ? ?Ileana Ladd Kyle Stansell, LCSWA ?09/19/2021  2:56 PM   ?

## 2021-09-19 NOTE — Progress Notes (Signed)
[facility] MD Progress Note ? ?09/19/2021 11:02 AM ?Nicholas Barron  ?MRN:  [id number] ?Subjective: Patient seen and chart reviewed.  26 year old man with schizoaffective disorder.  No new complaints today.  Still not very talkative often withdrawn lots of negative symptoms type behavior.  Hard to differentiate from any depression.  He denies suicidal thoughts however and denies that he is having hallucinations.  He has been more interactive on the unit and is cooperative with and tolerating medicine ?Principal Problem: Schizoaffective disorder ([place]) ?Diagnosis: Principal Problem: ?  Schizoaffective disorder ([place]) ? ?Total Time spent with patient: 30 minutes ? ?Past Psychiatric History: Past history of schizophrenia or schizoaffective disorder ? ?Past Medical History:  ?Past Medical History:  ?Diagnosis Date  ? Schizophrenia ([place])   ? History reviewed. No pertinent surgical history. ?Family History: History reviewed. No pertinent family history. ?Family Psychiatric  History: See previous ?Social History:  ?Social History  ? ?Substance and Sexual Activity  ?Alcohol Use Yes  ?   ?Social History  ? ?Substance and Sexual Activity  ?Drug Use Not Currently  ? Types: Marijuana  ?  ?Social History  ? ?Socioeconomic History  ? Marital status: Single  ?  Spouse name: Not on file  ? Number of children: Not on file  ? Years of education: Not on file  ? Highest education level: Not on file  ?Occupational History  ? Not on file  ?Tobacco Use  ? Smoking status: Former  ? Smokeless tobacco: Never  ?Substance and Sexual Activity  ? Alcohol use: Yes  ? Drug use: Not Currently  ?  Types: Marijuana  ? Sexual activity: Yes  ?Other Topics Concern  ? Not on file  ?Social History Narrative  ? Not on file  ? ?Social Determinants of Health  ? ?Financial Resource Strain: Not on file  ?Food Insecurity: Not on file  ?Transportation Needs: Not on file  ?Physical Activity: Not on file  ?Stress: Not on file  ?Social Connections: Not on file  ? ?Additional  Social History:  ?  ?  ?  ?  ?  ?  ?  ?  ?  ?  ?  ? ?Sleep: Fair ? ?Appetite:  Fair ? ?Current Medications: ?Current Facility-Administered Medications  ?Medication Dose Route Frequency Provider Last Rate Last Admin  ? acetaminophen (TYLENOL) tablet 650 mg  650 mg Oral Q6H PRN Sherlon Handing, NP      ? alum & mag hydroxide-simeth (MAALOX/MYLANTA) 200-200-20 MG/5ML suspension 30 mL  30 mL Oral Q4H PRN Waldon Merl F, NP      ? benztropine (COGENTIN) tablet 0.5 mg  0.5 mg Oral BID Waldon Merl F, NP   0.5 mg at 09/19/21 [id number]  ? haloperidol (HALDOL) tablet 5 mg  5 mg Oral BID Waldon Merl F, NP   5 mg at 09/19/21 [id number]  ? haloperidol decanoate (HALDOL DECANOATE) 100 MG/ML injection 100 mg  100 mg Intramuscular Q30 days Lurena Naeve, Madie Reno, MD      ? hydrOXYzine (ATARAX) tablet 50 mg  50 mg Oral Q6H PRN Rand Boller T, MD   50 mg at 09/17/21 0831  ? magnesium hydroxide (MILK OF MAGNESIA) suspension 30 mL  30 mL Oral Daily PRN Waldon Merl F, NP      ? traZODone (DESYREL) tablet 100 mg  100 mg Oral QHS PRN Kenyetta Fife, Madie Reno, MD      ? ? ?Lab Results: No results found for this or any previous visit (from the past 48 hour(s)). ? ?Blood  Alcohol level:  ?Lab Results  ?Component Value Date  ? ETH <10 09/15/2021  ? ETH <10 04/06/2020  ? ? ?Metabolic Disorder Labs: ?Lab Results  ?Component Value Date  ? HGBA1C 5.2 09/17/2021  ? MPG 102.54 09/17/2021  ? MPG 102.54 09/15/2021  ? ?No results found for: PROLACTIN ?Lab Results  ?Component Value Date  ? CHOL 144 09/17/2021  ? TRIG 47 09/17/2021  ? HDL 30 (L) 09/17/2021  ? CHOLHDL 4.8 09/17/2021  ? VLDL 9 09/17/2021  ? LDLCALC 105 (H) 09/17/2021  ? LDLCALC 118 (H) 03/31/2020  ? ? ?Physical Findings: ?AIMS:  , ,  ,  ,    ?CIWA:    ?COWS:    ? ?Musculoskeletal: ?Strength & Muscle Tone: within normal limits ?Gait & Station: normal ?Patient leans: N/A ? ?Psychiatric Specialty Exam: ? ?Presentation  ?General Appearance: Appropriate for Environment ? ?Eye  Contact:Good ? ?Speech:Clear and Coherent ? ?Speech Volume:Decreased ? ?Handedness:Right ? ? ?Mood and Affect  ?Mood:Euthymic ? ?Affect:Blunt; Congruent ? ? ?Thought Process  ?Thought Processes:Coherent ? ?Descriptions of Associations:Intact ? ?Orientation:Full (Time, Place and Person) ? ?Thought Content:Logical ? ?History of Schizophrenia/Schizoaffective disorder:Yes ? ?Duration of Psychotic Symptoms:Greater than six months ? ?Hallucinations:No data recorded ?Ideas of Reference:None ? ?Suicidal Thoughts:No data recorded ?Homicidal Thoughts:No data recorded ? ?Sensorium  ?Memory:Immediate Fair; Recent Fair; Remote Fair ? ?Judgment:Fair ? ?Insight:Fair ? ? ?Executive Functions  ?Concentration:Fair ? ?Attention Span:Fair ? ?Recall:Fair ? ?[place] ? ?Language:Fair ? ? ?Psychomotor Activity  ?Psychomotor Activity:No data recorded ? ?Assets  ?Assets:Communication Skills; Resilience; Social Support ? ? ?Sleep  ?Sleep:No data recorded ? ? ?Physical Exam: ?Physical Exam ?Vitals and nursing note reviewed.  ?Constitutional:   ?   Appearance: Normal appearance.  ?HENT:  ?   Head: Normocephalic and atraumatic.  ?   Mouth/Throat:  ?   Pharynx: Oropharynx is clear.  ?Eyes:  ?   Pupils: Pupils are equal, round, and reactive to light.  ?Cardiovascular:  ?   Rate and Rhythm: Normal rate and regular rhythm.  ?Pulmonary:  ?   Effort: Pulmonary effort is normal.  ?   Breath sounds: Normal breath sounds.  ?Abdominal:  ?   General: Abdomen is flat.  ?   Palpations: Abdomen is soft.  ?Musculoskeletal:     ?   General: Normal range of motion.  ?Skin: ?   General: Skin is warm and dry.  ?Neurological:  ?   General: No focal deficit present.  ?   Mental Status: He is alert. Mental status is at baseline.  ?Psychiatric:     ?   Attention and Perception: He is inattentive.     ?   Mood and Affect: Mood normal. Affect is blunt.     ?   Speech: Speech is delayed.     ?   Behavior: Behavior is slowed.     ?   Thought Content:  Thought content normal. Thought content does not include suicidal ideation.     ?   Cognition and Memory: Memory is impaired.  ? ?Review of Systems  ?Constitutional: Negative.   ?HENT: Negative.    ?Eyes: Negative.   ?Respiratory: Negative.    ?Cardiovascular: Negative.   ?Gastrointestinal: Negative.   ?Musculoskeletal: Negative.   ?Skin: Negative.   ?Neurological: Negative.   ?Psychiatric/Behavioral: Negative.    ?Blood pressure 115/74, pulse 64, temperature 97.6 ?F (36.4 ?C), temperature source Oral, resp. rate 18, height 5' 10.47" (1.79 m), weight 103.9 kg, SpO2 100 %. Body mass index  is 32.42 kg/m?. ? ? ?Treatment Plan Summary: ?Medication management and Plan plan is to restart him on haloperidol decanoate which had been his previous medication.  Orders written for 100 mg haloperidol decanoate shot today without changing the oral dosage.  Encourage patient to talk with staff about any concerns or questions.  Encourage patient to engage with the unit. ? ?Alethia Berthold, MD ?09/19/2021, 11:02 AM ? ?

## 2021-09-20 DIAGNOSIS — F251 Schizoaffective disorder, depressive type: Secondary | ICD-10-CM | POA: Diagnosis not present

## 2021-09-20 MED ORDER — LITHIUM CARBONATE ER 300 MG PO TBCR
300.0000 mg | EXTENDED_RELEASE_TABLET | Freq: Two times a day (BID) | ORAL | Status: DC
  Administered 2021-09-20 – 2021-09-22 (×5): 300 mg via ORAL
  Filled 2021-09-20 (×5): qty 1

## 2021-09-20 NOTE — Plan of Care (Signed)
D: Pt alert and oriented. Pt rates depression 8/10, hopelessness 8/10, and anxiety 8/10. Pt goal: "Everything." Pt reports energy level as low and concentration as being good. Pt reports sleep last night as being fair. Pt did not receive medications for sleep. Pt denies experiencing any pain at this time. Pt denies experiencing any SI/HI, or AVH at this time.  ? ?Pt appeared paranoid about afternoon scheduled medication, however was compliant with medication once educated about what it was for.  ? ?A: Scheduled medications administered to pt, per MD orders. Support and encouragement provided. Frequent verbal contact made. Routine safety checks conducted q15 minutes.  ? ?R: No adverse drug reactions noted. Pt verbally contracts for safety at this time. Pt compliant with medications. Pt interacts minimally with others on the unit, mostly self isolating to room with exception to meals. Pt remains safe at this time. Will continue to monitor.  ? ?Problem: Activity: ?Goal: Interest or engagement in activities will improve ?Outcome: Progressing ?  ?Problem: Education: ?Goal: Emotional status will improve ?Outcome: Not Progressing ?  ?

## 2021-09-20 NOTE — Progress Notes (Signed)
[facility] MD Progress Note ? ?09/20/2021 11:28 AM ?Ardath Sax  ?MRN:  [id number] ?Subjective: Follow-up with this patient who presented with both psychotic symptoms and symptoms of suicidal ideation and paranoia.  Patient continues to spend a lot of time laying in bed.  I went in and spoke with him today and tried to form some rapport but he is very flattened and withdrawn.  He tells me he feels a little bit better than when he came in but has a very hard time describing it.  The only thing he will say is that there has been a change "in how I see things".  He denies current hallucinations.  Denies active suicidal ideation but he is very blunted and slow and hesitant about discussing his plans for the future.  He did get a Haldol decanoate shot yesterday and continues on the haloperidol ?Principal Problem: Schizoaffective disorder (HCC) ?Diagnosis: Principal Problem: ?  Schizoaffective disorder (HCC) ? ?Total Time spent with patient: 30 minutes ? ?Past Psychiatric History: Past history of multiple hospitalizations and presentations for treatment over about the last 3 years.  Consistently showing symptoms of psychosis but has shown both up and down moods.  Only documented medicines have been haloperidol ? ?Past Medical History:  ?Past Medical History:  ?Diagnosis Date  ? Schizophrenia (HCC)   ? History reviewed. No pertinent surgical history. ?Family History: History reviewed. No pertinent family history. ?Family Psychiatric  History: None reported ?Social History:  ?Social History  ? ?Substance and Sexual Activity  ?Alcohol Use Yes  ?   ?Social History  ? ?Substance and Sexual Activity  ?Drug Use Not Currently  ? Types: Marijuana  ?  ?Social History  ? ?Socioeconomic History  ? Marital status: Single  ?  Spouse name: Not on file  ? Number of children: Not on file  ? Years of education: Not on file  ? Highest education level: Not on file  ?Occupational History  ? Not on file  ?Tobacco Use  ? Smoking status: Former  ?  Smokeless tobacco: Never  ?Substance and Sexual Activity  ? Alcohol use: Yes  ? Drug use: Not Currently  ?  Types: Marijuana  ? Sexual activity: Yes  ?Other Topics Concern  ? Not on file  ?Social History Narrative  ? Not on file  ? ?Social Determinants of Health  ? ?Financial Resource Strain: Not on file  ?Food Insecurity: Not on file  ?Transportation Needs: Not on file  ?Physical Activity: Not on file  ?Stress: Not on file  ?Social Connections: Not on file  ? ?Additional Social History:  ?  ?  ?  ?  ?  ?  ?  ?  ?  ?  ?  ? ?Sleep: Fair ? ?Appetite:  Fair ? ?Current Medications: ?Current Facility-Administered Medications  ?Medication Dose Route Frequency Provider Last Rate Last Admin  ? acetaminophen (TYLENOL) tablet 650 mg  650 mg Oral Q6H PRN Vanetta Mulders, NP      ? alum & mag hydroxide-simeth (MAALOX/MYLANTA) 200-200-20 MG/5ML suspension 30 mL  30 mL Oral Q4H PRN Gabriel Cirri F, NP      ? benztropine (COGENTIN) tablet 0.5 mg  0.5 mg Oral BID Gabriel Cirri F, NP   0.5 mg at 09/20/21 2595  ? haloperidol (HALDOL) tablet 5 mg  5 mg Oral BID Gabriel Cirri F, NP   5 mg at 09/20/21 0734  ? haloperidol decanoate (HALDOL DECANOATE) 100 MG/ML injection 100 mg  100 mg Intramuscular Q30 days Winnona Wargo,  Jackquline DenmarkJohn T, MD   100 mg at 09/19/21 1414  ? hydrOXYzine (ATARAX) tablet 50 mg  50 mg Oral Q6H PRN Yeily Link, Jackquline DenmarkJohn T, MD   50 mg at 09/17/21 0831  ? lithium carbonate (LITHOBID) CR tablet 300 mg  300 mg Oral Q12H Ric Rosenberg T, MD      ? magnesium hydroxide (MILK OF MAGNESIA) suspension 30 mL  30 mL Oral Daily PRN Gabriel CirriBarthold, Louise F, NP      ? traZODone (DESYREL) tablet 100 mg  100 mg Oral QHS PRN Yelina Sarratt, Jackquline DenmarkJohn T, MD      ? ? ?Lab Results: No results found for this or any previous visit (from the past 48 hour(s)). ? ?Blood Alcohol level:  ?Lab Results  ?Component Value Date  ? ETH <10 09/15/2021  ? ETH <10 04/06/2020  ? ? ?Metabolic Disorder Labs: ?Lab Results  ?Component Value Date  ? HGBA1C 5.2 09/17/2021  ? MPG  102.54 09/17/2021  ? MPG 102.54 09/15/2021  ? ?No results found for: PROLACTIN ?Lab Results  ?Component Value Date  ? CHOL 144 09/17/2021  ? TRIG 47 09/17/2021  ? HDL 30 (L) 09/17/2021  ? CHOLHDL 4.8 09/17/2021  ? VLDL 9 09/17/2021  ? LDLCALC 105 (H) 09/17/2021  ? LDLCALC 118 (H) 03/31/2020  ? ? ?Physical Findings: ?AIMS:  , ,  ,  ,    ?CIWA:    ?COWS:    ? ?Musculoskeletal: ?Strength & Muscle Tone: within normal limits ?Gait & Station: normal ?Patient leans: N/A ? ?Psychiatric Specialty Exam: ? ?Presentation  ?General Appearance: Appropriate for Environment ? ?Eye Contact:Good ? ?Speech:Clear and Coherent ? ?Speech Volume:Decreased ? ?Handedness:Right ? ? ?Mood and Affect  ?Mood:Euthymic ? ?Affect:Blunt; Congruent ? ? ?Thought Process  ?Thought Processes:Coherent ? ?Descriptions of Associations:Intact ? ?Orientation:Full (Time, Place and Person) ? ?Thought Content:Logical ? ?History of Schizophrenia/Schizoaffective disorder:Yes ? ?Duration of Psychotic Symptoms:Greater than six months ? ?Hallucinations:No data recorded ?Ideas of Reference:None ? ?Suicidal Thoughts:No data recorded ?Homicidal Thoughts:No data recorded ? ?Sensorium  ?Memory:Immediate Fair; Recent Fair; Remote Fair ? ?Judgment:Fair ? ?Insight:Fair ? ? ?Executive Functions  ?Concentration:Fair ? ?Attention Span:Fair ? ?Recall:Fair ? ?Fund of Knowledge:Fair ? ?Language:Fair ? ? ?Psychomotor Activity  ?Psychomotor Activity:No data recorded ? ?Assets  ?Assets:Communication Skills; Resilience; Social Support ? ? ?Sleep  ?Sleep:No data recorded ? ? ?Physical Exam: ?Physical Exam ?Vitals and nursing note reviewed.  ?Constitutional:   ?   Appearance: Normal appearance.  ?HENT:  ?   Head: Normocephalic and atraumatic.  ?   Mouth/Throat:  ?   Pharynx: Oropharynx is clear.  ?Eyes:  ?   Pupils: Pupils are equal, round, and reactive to light.  ?Cardiovascular:  ?   Rate and Rhythm: Normal rate and regular rhythm.  ?Pulmonary:  ?   Effort: Pulmonary effort is  normal.  ?   Breath sounds: Normal breath sounds.  ?Abdominal:  ?   General: Abdomen is flat.  ?   Palpations: Abdomen is soft.  ?Musculoskeletal:     ?   General: Normal range of motion.  ?Skin: ?   General: Skin is warm and dry.  ?Neurological:  ?   General: No focal deficit present.  ?   Mental Status: He is alert. Mental status is at baseline.  ?Psychiatric:     ?   Attention and Perception: He is inattentive.     ?   Mood and Affect: Mood normal. Affect is blunt.     ?   Speech: Speech is delayed.     ?  Behavior: Behavior is slowed.     ?   Thought Content: Thought content is paranoid. Thought content does not include homicidal or suicidal ideation.     ?   Cognition and Memory: Cognition is impaired.  ? ?Review of Systems  ?Constitutional: Negative.   ?HENT: Negative.    ?Eyes: Negative.   ?Respiratory: Negative.    ?Cardiovascular: Negative.   ?Gastrointestinal: Negative.   ?Musculoskeletal: Negative.   ?Skin: Negative.   ?Neurological: Negative.   ?Psychiatric/Behavioral:  Negative for depression, hallucinations, substance abuse and suicidal ideas. The patient is not nervous/anxious.   ?Blood pressure 110/80, pulse 94, temperature 98.5 ?F (36.9 ?C), temperature source Oral, resp. rate 16, height 5' 10.47" (1.79 m), weight 103.9 kg, SpO2 100 %. Body mass index is 32.42 kg/m?. ? ? ?Treatment Plan Summary: ?Medication management and Plan I am concerned that after several days on haloperidol he still seems very withdrawn.  It is very hard to engage him in conversation to really find out what his symptoms are.  Going back over his chart it is clear that he has had suicidal ideation and self-injuring behavior and depressed mood but has also had episodes that were more manic like grandiose and aggressive.  At this point I think it is probably reasonable to try to add something else to treat the mood symptoms.  He had been on Zoloft in the past but with the concern about mood lability I am going to suggest trying  initially a little bit of lithium.  Reviewed plan with patient.  Reviewed labs.  Kidneys working fine.  Starting lithium 300 mg twice a day ? ?Mordecai Rasmussen, MD ?09/20/2021, 11:28 AM ? ?

## 2021-09-20 NOTE — Group Note (Signed)
[facility] LCSW Group Therapy Note ? ? ? ?Group Date: 09/20/2021 ?Start Time: 1300 ?End Time: 1400 ? ?Type of Therapy and Topic:  Group Therapy:  Overcoming Obstacles ? ?Participation Level:  [facility] PARTICIPATION LEVEL: None ? ?Mood: ? ?Description of Group:   ?In this group patients will be encouraged to explore what they see as obstacles to their own wellness and recovery. They will be guided to discuss their thoughts, feelings, and behaviors related to these obstacles. The group will process together ways to cope with barriers, with attention given to specific choices patients can make. Each patient will be challenged to identify changes they are motivated to make in order to overcome their obstacles. This group will be process-oriented, with patients participating in exploration of their own experiences as well as giving and receiving support and challenge from other group members. ? ?Therapeutic Goals: ?1. Patient will identify personal and current obstacles as they relate to admission. ?2. Patient will identify barriers that currently interfere with their wellness or overcoming obstacles.  ?3. Patient will identify feelings, thought process and behaviors related to these barriers. ?4. Patient will identify two changes they are willing to make to overcome these obstacles:  ? ? ?Summary of Patient Progress ?c ? ?Therapeutic Modalities:   ?Cognitive Behavioral Therapy ?Solution Focused Therapy ?Motivational Interviewing ?Relapse Prevention Therapy ? ? ?Harden Mo, LCSW ?

## 2021-09-20 NOTE — Plan of Care (Signed)
°  Problem: Group Participation °Goal: STG - Patient will engage in groups without prompting or encouragement from LRT x3 group sessions within 5 recreation therapy group sessions °Description: STG - Patient will engage in groups without prompting or encouragement from LRT x3 group sessions within 5 recreation therapy group sessions °Outcome: Progressing °  °

## 2021-09-20 NOTE — Progress Notes (Signed)
Patient was pleasant and cooperative on shift, he denies SI, HI & AVH. He reported no new concerns or no new behavioral issues on shift to report thus far. ?

## 2021-09-20 NOTE — Progress Notes (Signed)
Recreation Therapy Notes ? ?Date: 09/20/2021 ? ?Time: 10:30 am   ? ?Location: Craft room   ? ?Behavioral response: Appropriate ? ?Intervention Topic: Self-care  ? ?Discussion/Intervention:  ?Group content today was focused on Self-Care. The group defined self-care and some positive ways they care for themselves. Individuals expressed ways and reasons why they neglected any self-care in the past. Patients described ways to improve self-care in the future. The group explained what could happen if they did not do any self-care activities at all. The group participated in the intervention ?self-care assessment? where they had a chance to discover some of their weaknesses and strengths in self- care. Patient came up with a self-care plan to improve themselves in the future.  ?Clinical Observations/Feedback: ?Patient came to group late due to unknown reasons. Individual was social with peers and staff while participating in the intervention.    ?Nicholas Barron LRT/CTRS  ? ? ? ? ? ? ? ?Nicholas Barron ?09/20/2021 11:54 AM ?

## 2021-09-21 DIAGNOSIS — F251 Schizoaffective disorder, depressive type: Secondary | ICD-10-CM | POA: Diagnosis not present

## 2021-09-21 NOTE — Progress Notes (Signed)
Patient calm and pleasant during assessment denying SI/HI/AVH. Pt observed interacting appropriately with staff and peers on the unit. Pt compliant with medication administration per MD orders. Pt given education, support, and encouragement to be active in his treatment plan. Pt being monitored Q 15 minutes for safety per unit protocol. Pt remains safe on the unit.  °

## 2021-09-21 NOTE — Group Note (Signed)
LCSW Group Therapy Note ? ?Group Date: 09/21/2021 ?Start Time: 1300 ?End Time: 1400 ? ? ?Type of Therapy and Topic:  Group Therapy - How To Cope with Nervousness about Discharge  ? ?Participation Level:  Minimal  ? ?Description of Group ?This process group involved identification of patients' feelings about discharge. Some of them are scheduled to be discharged soon, while others are new admissions, but each of them was asked to share thoughts and feelings surrounding discharge from the hospital. One common theme was that they are excited at the prospect of going home, while another was that many of them are apprehensive about sharing why they were hospitalized. Patients were given the opportunity to discuss these feelings with their peers in preparation for discharge. ? ?Therapeutic Goals ? ?Patient will identify their overall feelings about pending discharge. ?Patient will think about how they might proactively address issues that they believe will once again arise once they get home (i.e. with parents). ?Patients will participate in discussion about having hope for change. ? ? ?Summary of Patient Progress:   ?Patient was present for the entirety of group session. Patient participated in opening and closing remarks. However, patient did not contribute at all to the topic of discussion despite encouraged participation.  ? ?Therapeutic Modalities ?Cognitive Behavioral Therapy ? ? ?Corky Crafts, LCSWA ?09/21/2021  2:34 PM   ?

## 2021-09-21 NOTE — Progress Notes (Signed)
Recreation Therapy Notes ? ?Date: 09/21/2021 ? ?Time: 10:05 am   ? ?Location: Courtyard    ? ?Behavioral response: Appropriate ? ?Intervention Topic: Leisure   ? ?Discussion/Intervention:  ?Group content today was focused on leisure. The group defined what leisure is and some positive leisure activities they participate in. Individuals identified the difference between good and bad leisure. Participants expressed how they feel after participating in the leisure of their choice. The group discussed how they go about picking a leisure activity and if others are involved in their leisure activities. The patient stated how many leisure activities they have to choose from and reasons why it is important to have leisure time. Individuals participated in the intervention ?Exploration of Leisure? where they had a chance to identify new leisure activities as well as benefits of leisure. ?Clinical Observations/Feedback: ?Patient came to group late and was able to explore participate in many leisure activities. Participant was able to identify leisure activities they enjoy outside of the hospital. Individual was social with peers and staff while participating in the intervention.    ?Nicholas Barron LRT/CTRS  ? ? ? ? ? ? ? ? ? ? ? ?Nicholas Barron ?09/21/2021 11:57 AM ?

## 2021-09-21 NOTE — Progress Notes (Signed)
[facility] MD Progress Note ? ?09/21/2021 2:31 PM ?Ardath Sax  ?MRN:  [id number] ?Subjective: Patient seen and chart reviewed.  Patient says he is feeling better than when he came in.  This is consistent with his behavior.  He is out of his room more and interacting with others.  Looks a lot more relaxed.  Makes good eye contact during the conversation.  Denies suicidal thought denies any hallucinations today ?Principal Problem: Schizoaffective disorder (HCC) ?Diagnosis: Principal Problem: ?  Schizoaffective disorder (HCC) ? ?Total Time spent with patient: 30 minutes ? ?Past Psychiatric History: Past history of recurrent psychotic disorder ? ?Past Medical History:  ?Past Medical History:  ?Diagnosis Date  ? Schizophrenia (HCC)   ? History reviewed. No pertinent surgical history. ?Family History: History reviewed. No pertinent family history. ?Family Psychiatric  History: See previous ?Social History:  ?Social History  ? ?Substance and Sexual Activity  ?Alcohol Use Yes  ?   ?Social History  ? ?Substance and Sexual Activity  ?Drug Use Not Currently  ? Types: Marijuana  ?  ?Social History  ? ?Socioeconomic History  ? Marital status: Single  ?  Spouse name: Not on file  ? Number of children: Not on file  ? Years of education: Not on file  ? Highest education level: Not on file  ?Occupational History  ? Not on file  ?Tobacco Use  ? Smoking status: Former  ? Smokeless tobacco: Never  ?Substance and Sexual Activity  ? Alcohol use: Yes  ? Drug use: Not Currently  ?  Types: Marijuana  ? Sexual activity: Yes  ?Other Topics Concern  ? Not on file  ?Social History Narrative  ? Not on file  ? ?Social Determinants of Health  ? ?Financial Resource Strain: Not on file  ?Food Insecurity: Not on file  ?Transportation Needs: Not on file  ?Physical Activity: Not on file  ?Stress: Not on file  ?Social Connections: Not on file  ? ?Additional Social History:  ?  ?  ?  ?  ?  ?  ?  ?  ?  ?  ?  ? ?Sleep: Fair ? ?Appetite:  Fair ? ?Current  Medications: ?Current Facility-Administered Medications  ?Medication Dose Route Frequency Provider Last Rate Last Admin  ? acetaminophen (TYLENOL) tablet 650 mg  650 mg Oral Q6H PRN Vanetta Mulders, NP      ? alum & mag hydroxide-simeth (MAALOX/MYLANTA) 200-200-20 MG/5ML suspension 30 mL  30 mL Oral Q4H PRN Gabriel Cirri F, NP      ? benztropine (COGENTIN) tablet 0.5 mg  0.5 mg Oral BID Gabriel Cirri F, NP   0.5 mg at 09/21/21 0840  ? haloperidol (HALDOL) tablet 5 mg  5 mg Oral BID Gabriel Cirri F, NP   5 mg at 09/21/21 0840  ? haloperidol decanoate (HALDOL DECANOATE) 100 MG/ML injection 100 mg  100 mg Intramuscular Q30 days Coriana Angello T, MD   100 mg at 09/19/21 1414  ? hydrOXYzine (ATARAX) tablet 50 mg  50 mg Oral Q6H PRN Maryann Mccall, Jackquline Denmark, MD   50 mg at 09/17/21 0831  ? lithium carbonate (LITHOBID) CR tablet 300 mg  300 mg Oral Q12H Kynsley Whitehouse, Jackquline Denmark, MD   300 mg at 09/21/21 0840  ? magnesium hydroxide (MILK OF MAGNESIA) suspension 30 mL  30 mL Oral Daily PRN Gabriel Cirri F, NP      ? traZODone (DESYREL) tablet 100 mg  100 mg Oral QHS PRN Cristan Hout, Jackquline Denmark, MD   100 mg at  09/20/21 2110  ? ? ?Lab Results: No results found for this or any previous visit (from the past 48 hour(s)). ? ?Blood Alcohol level:  ?Lab Results  ?Component Value Date  ? ETH <10 09/15/2021  ? ETH <10 04/06/2020  ? ? ?Metabolic Disorder Labs: ?Lab Results  ?Component Value Date  ? HGBA1C 5.2 09/17/2021  ? MPG 102.54 09/17/2021  ? MPG 102.54 09/15/2021  ? ?No results found for: PROLACTIN ?Lab Results  ?Component Value Date  ? CHOL 144 09/17/2021  ? TRIG 47 09/17/2021  ? HDL 30 (L) 09/17/2021  ? CHOLHDL 4.8 09/17/2021  ? VLDL 9 09/17/2021  ? LDLCALC 105 (H) 09/17/2021  ? LDLCALC 118 (H) 03/31/2020  ? ? ?Physical Findings: ?AIMS:  , ,  ,  ,    ?CIWA:    ?COWS:    ? ?Musculoskeletal: ?Strength & Muscle Tone: within normal limits ?Gait & Station: normal ?Patient leans: N/A ? ?Psychiatric Specialty Exam: ? ?Presentation  ?General  Appearance: Appropriate for Environment ? ?Eye Contact:Good ? ?Speech:Clear and Coherent ? ?Speech Volume:Decreased ? ?Handedness:Right ? ? ?Mood and Affect  ?Mood:Euthymic ? ?Affect:Blunt; Congruent ? ? ?Thought Process  ?Thought Processes:Coherent ? ?Descriptions of Associations:Intact ? ?Orientation:Full (Time, Place and Person) ? ?Thought Content:Logical ? ?History of Schizophrenia/Schizoaffective disorder:Yes ? ?Duration of Psychotic Symptoms:Greater than six months ? ?Hallucinations:No data recorded ?Ideas of Reference:None ? ?Suicidal Thoughts:No data recorded ?Homicidal Thoughts:No data recorded ? ?Sensorium  ?Memory:Immediate Fair; Recent Fair; Remote Fair ? ?Judgment:Fair ? ?Insight:Fair ? ? ?Executive Functions  ?Concentration:Fair ? ?Attention Span:Fair ? ?Recall:Fair ? ?Fund of Knowledge:Fair ? ?Language:Fair ? ? ?Psychomotor Activity  ?Psychomotor Activity:No data recorded ? ?Assets  ?Assets:Communication Skills; Resilience; Social Support ? ? ?Sleep  ?Sleep:No data recorded ? ? ?Physical Exam: ?Physical Exam ?Vitals and nursing note reviewed.  ?Constitutional:   ?   Appearance: Normal appearance.  ?HENT:  ?   Head: Normocephalic and atraumatic.  ?   Mouth/Throat:  ?   Pharynx: Oropharynx is clear.  ?Eyes:  ?   Pupils: Pupils are equal, round, and reactive to light.  ?Cardiovascular:  ?   Rate and Rhythm: Normal rate and regular rhythm.  ?Pulmonary:  ?   Effort: Pulmonary effort is normal.  ?   Breath sounds: Normal breath sounds.  ?Abdominal:  ?   General: Abdomen is flat.  ?   Palpations: Abdomen is soft.  ?Musculoskeletal:     ?   General: Normal range of motion.  ?Skin: ?   General: Skin is warm and dry.  ?Neurological:  ?   General: No focal deficit present.  ?   Mental Status: He is alert. Mental status is at baseline.  ?Psychiatric:     ?   Attention and Perception: Attention normal. He does not perceive auditory hallucinations.     ?   Mood and Affect: Mood normal.     ?   Speech: Speech is  delayed.     ?   Behavior: Behavior is slowed.     ?   Thought Content: Thought content normal. Thought content does not include suicidal ideation.     ?   Cognition and Memory: Cognition normal.  ? ?Review of Systems  ?Constitutional: Negative.   ?HENT: Negative.    ?Eyes: Negative.   ?Respiratory: Negative.    ?Cardiovascular: Negative.   ?Gastrointestinal: Negative.   ?Musculoskeletal: Negative.   ?Skin: Negative.   ?Neurological: Negative.   ?Psychiatric/Behavioral: Negative.    ?Blood pressure 118/78, pulse 65, temperature  98.3 ?F (36.8 ?C), temperature source Oral, resp. rate 18, height 5' 10.47" (1.79 m), weight 103.9 kg, SpO2 100 %. Body mass index is 32.42 kg/m?. ? ? ?Treatment Plan Summary: ?Patient seen and chart reviewed.  Patient is looking better.  No change to medicine.  Reviewed medication plan with patient.  Reviewed the importance of outpatient follow-up.  Possible discharge tomorrow ? ?Mordecai Rasmussen, MD ?09/21/2021, 2:31 PM ? ?

## 2021-09-21 NOTE — Plan of Care (Signed)
D- Patient alert and oriented. Patient presented in a pleasant mood on assessment reporting that he slept fair last night and had no complaints to voice to this Clinical research associate. Patient denied SI, HI, AVH, and pain at this time. Patient also denied any signs/symptoms of depression/anxiety, although he endorsed them, along with hopelessness on his self-inventory. When this writer asked patient to elaborate on this, he stated "I was just waking up, I'm good". Patient's goal for today, per his self-inventory, is "everything". ? ?A- Scheduled medications administered to patient, per MD orders. Support and encouragement provided.  Routine safety checks conducted every 15 minutes.  Patient informed to notify staff with problems or concerns. ? ?R- No adverse drug reactions noted. Patient contracts for safety at this time. Patient compliant with medications and treatment plan. Patient receptive, calm, and cooperative. Patient interacts well with others on the unit.  Patient remains safe at this time. ? ?Problem: Education: ?Goal: Knowledge of [place] [facility] Education information/materials will improve ?Outcome: Progressing ?Goal: Emotional status will improve ?Outcome: Progressing ?Goal: Mental status will improve ?Outcome: Progressing ?Goal: Verbalization of understanding the information provided will improve ?Outcome: Progressing ?  ?Problem: Activity: ?Goal: Interest or engagement in activities will improve ?Outcome: Progressing ?Goal: Sleeping patterns will improve ?Outcome: Progressing ?  ?Problem: Coping: ?Goal: Ability to verbalize frustrations and anger appropriately will improve ?Outcome: Progressing ?Goal: Ability to demonstrate self-control will improve ?Outcome: Progressing ?  ?Problem: Health Behavior/Discharge Planning: ?Goal: Identification of resources available to assist in meeting health care needs will improve ?Outcome: Progressing ?Goal: Compliance with treatment plan for underlying cause of condition will  improve ?Outcome: Progressing ?  ?Problem: Physical Regulation: ?Goal: Ability to maintain clinical measurements within normal limits will improve ?Outcome: Progressing ?  ?Problem: Safety: ?Goal: Periods of time without injury will increase ?Outcome: Progressing ?  ?Problem: Activity: ?Goal: Will verbalize the importance of balancing activity with adequate rest periods ?Outcome: Progressing ?  ?Problem: Education: ?Goal: Will be free of psychotic symptoms ?Outcome: Progressing ?Goal: Knowledge of the prescribed therapeutic regimen will improve ?Outcome: Progressing ?  ?Problem: Coping: ?Goal: Coping ability will improve ?Outcome: Progressing ?Goal: Will verbalize feelings ?Outcome: Progressing ?  ?Problem: Health Behavior/Discharge Planning: ?Goal: Compliance with prescribed medication regimen will improve ?Outcome: Progressing ?  ?Problem: Nutritional: ?Goal: Ability to achieve adequate nutritional intake will improve ?Outcome: Progressing ?  ?Problem: Role Relationship: ?Goal: Ability to communicate needs accurately will improve ?Outcome: Progressing ?Goal: Ability to interact with others will improve ?Outcome: Progressing ?  ?Problem: Safety: ?Goal: Ability to redirect hostility and anger into socially appropriate behaviors will improve ?Outcome: Progressing ?Goal: Ability to remain free from injury will improve ?Outcome: Progressing ?  ?Problem: Self-Care: ?Goal: Ability to participate in self-care as condition permits will improve ?Outcome: Progressing ?  ?Problem: Self-Concept: ?Goal: Will verbalize positive feelings about self ?Outcome: Progressing ?  ?

## 2021-09-21 NOTE — Progress Notes (Signed)
Patient pleasant and cooperative with care. Affect flat but brightens on approach. Somewhat paranoid. Medication compliant. Minimal interaction with staff and peers. Medication given for sleep with partial relief. Patient had hard time staying asleep early shift. Was noted on floor in dayroom sleeping for a short period.  ?Encouragement and support provided. Safety checks maintained. Medications given as prescribed. Pt receptive and remains safe on unit with q 15 min checks. ?

## 2021-09-21 NOTE — Plan of Care (Signed)
°  Problem: Education: °Goal: Emotional status will improve °Outcome: Progressing °  °Problem: Coping: °Goal: Ability to verbalize frustrations and anger appropriately will improve °Outcome: Progressing °Goal: Ability to demonstrate self-control will improve °Outcome: Progressing °  °Problem: Safety: °Goal: Periods of time without injury will increase °Outcome: Progressing °  °

## 2021-09-21 NOTE — Progress Notes (Signed)
Patient has been more visible in the milieu that previously noted by this Clinical research associate. He has attended unit groups, as well as went outside to the courtyard with other members on the unit, without any issues. Patient remains safe at this time. ?

## 2021-09-22 ENCOUNTER — Other Ambulatory Visit: Payer: Self-pay

## 2021-09-22 DIAGNOSIS — F251 Schizoaffective disorder, depressive type: Secondary | ICD-10-CM | POA: Diagnosis not present

## 2021-09-22 MED ORDER — LITHIUM CARBONATE ER 300 MG PO TBCR
300.0000 mg | EXTENDED_RELEASE_TABLET | Freq: Two times a day (BID) | ORAL | 0 refills | Status: AC
Start: 2021-09-22 — End: ?
  Filled 2021-09-22: qty 20, 10d supply, fill #0

## 2021-09-22 MED ORDER — BENZTROPINE MESYLATE 0.5 MG PO TABS
0.5000 mg | ORAL_TABLET | Freq: Two times a day (BID) | ORAL | 0 refills | Status: AC
  Filled 2021-09-22: qty 20, 10d supply, fill #0

## 2021-09-22 MED ORDER — TRAZODONE HCL 100 MG PO TABS
100.0000 mg | ORAL_TABLET | Freq: Every evening | ORAL | 1 refills | Status: DC | PRN
Start: 2021-09-22 — End: 2021-09-22

## 2021-09-22 MED ORDER — HALOPERIDOL DECANOATE 100 MG/ML IM SOLN
100.0000 mg | INTRAMUSCULAR | 3 refills | Status: AC
Start: 2021-10-15 — End: ?

## 2021-09-22 MED ORDER — HALOPERIDOL 5 MG PO TABS
5.0000 mg | ORAL_TABLET | Freq: Two times a day (BID) | ORAL | 0 refills | Status: AC
Start: 2021-09-22 — End: ?
  Filled 2021-09-22: qty 20, 10d supply, fill #0

## 2021-09-22 MED ORDER — TRAZODONE HCL 100 MG PO TABS
100.0000 mg | ORAL_TABLET | Freq: Every evening | ORAL | 0 refills | Status: AC | PRN
Start: 2021-09-22 — End: ?
  Filled 2021-09-22: qty 10, 10d supply, fill #0

## 2021-09-22 MED ORDER — HALOPERIDOL 5 MG PO TABS: 5.0000 mg | ORAL_TABLET | Freq: Two times a day (BID) | ORAL | 1 refills | Status: DC

## 2021-09-22 MED ORDER — BENZTROPINE MESYLATE 0.5 MG PO TABS
0.5000 mg | ORAL_TABLET | Freq: Two times a day (BID) | ORAL | 1 refills | Status: DC
Start: 2021-09-22 — End: 2021-09-22

## 2021-09-22 MED ORDER — LITHIUM CARBONATE ER 300 MG PO TBCR
300.0000 mg | EXTENDED_RELEASE_TABLET | Freq: Two times a day (BID) | ORAL | 1 refills | Status: DC
Start: 2021-09-22 — End: 2021-09-22

## 2021-09-22 NOTE — BHH Suicide Risk Assessment (Signed)
[facility] Discharge Suicide Risk Assessment ? ? ?Principal Problem: Schizoaffective disorder (HCC) ?Discharge Diagnoses: Principal Problem: ?  Schizoaffective disorder (HCC) ? ? ?Total Time spent with patient: 30 minutes ? ?Musculoskeletal: ?Strength & Muscle Tone: within normal limits ?Gait & Station: normal ?Patient leans: N/A ? ?Psychiatric Specialty Exam ? ?Presentation  ?General Appearance: Appropriate for Environment ? ?Eye Contact:Good ? ?Speech:Clear and Coherent ? ?Speech Volume:Decreased ? ?Handedness:Right ? ? ?Mood and Affect  ?Mood:Euthymic ? ?Duration of Depression Symptoms: Greater than two weeks ? ?Affect:Blunt; Congruent ? ? ?Thought Process  ?Thought Processes:Coherent ? ?Descriptions of Associations:Intact ? ?Orientation:Full (Time, Place and Person) ? ?Thought Content:Logical ? ?History of Schizophrenia/Schizoaffective disorder:Yes ? ?Duration of Psychotic Symptoms:Greater than six months ? ?Hallucinations:No data recorded ?Ideas of Reference:None ? ?Suicidal Thoughts:No data recorded ?Homicidal Thoughts:No data recorded ? ?Sensorium  ?Memory:Immediate Fair; Recent Fair; Remote Fair ? ?Judgment:Fair ? ?Insight:Fair ? ? ?Executive Functions  ?Concentration:Fair ? ?Attention Span:Fair ? ?Recall:Fair ? ?Fund of Knowledge:Fair ? ?Language:Fair ? ? ?Psychomotor Activity  ?Psychomotor Activity:No data recorded ? ?Assets  ?Assets:Communication Skills; Resilience; Social Support ? ? ?Sleep  ?Sleep:No data recorded ? ?Physical Exam: ?Physical Exam ?Vitals and nursing note reviewed.  ?Constitutional:   ?   Appearance: Normal appearance.  ?HENT:  ?   Head: Normocephalic and atraumatic.  ?   Mouth/Throat:  ?   Pharynx: Oropharynx is clear.  ?Eyes:  ?   Pupils: Pupils are equal, round, and reactive to light.  ?Cardiovascular:  ?   Rate and Rhythm: Normal rate and regular rhythm.  ?Pulmonary:  ?   Effort: Pulmonary effort is normal.  ?   Breath sounds: Normal breath sounds.  ?Abdominal:  ?   General: Abdomen is  flat.  ?   Palpations: Abdomen is soft.  ?Musculoskeletal:     ?   General: Normal range of motion.  ?Skin: ?   General: Skin is warm and dry.  ?Neurological:  ?   General: No focal deficit present.  ?   Mental Status: He is alert. Mental status is at baseline.  ?Psychiatric:     ?   Attention and Perception: Attention normal.     ?   Mood and Affect: Mood normal.     ?   Speech: Speech normal.     ?   Behavior: Behavior is cooperative.     ?   Thought Content: Thought content normal.     ?   Cognition and Memory: Cognition normal.     ?   Judgment: Judgment normal.  ? ?Review of Systems  ?Constitutional: Negative.   ?HENT: Negative.    ?Eyes: Negative.   ?Respiratory: Negative.    ?Cardiovascular: Negative.   ?Gastrointestinal: Negative.   ?Musculoskeletal: Negative.   ?Skin: Negative.   ?Neurological: Negative.   ?Psychiatric/Behavioral: Negative.    ?Blood pressure 114/74, pulse 79, temperature 97.9 ?F (36.6 ?C), temperature source Oral, resp. rate 18, height 5' 10.47" (1.79 m), weight 103.9 kg, SpO2 100 %. Body mass index is 32.42 kg/m?. ? ?Mental Status Per Nursing Assessment::   ?On Admission:  NA ? ?Demographic Factors:  ?Male and Low socioeconomic status ? ?Loss Factors: ?Financial problems/change in socioeconomic status ? ?Historical Factors: ?Impulsivity ? ?Risk Reduction Factors:   ?Positive therapeutic relationship ? ?Continued Clinical Symptoms:  ?Bipolar Disorder:   Depressive phase ?Depression:   Impulsivity ?Schizophrenia:   Less than 87 years old ? ?Cognitive Features That Contribute To Risk:  ?None   ? ?Suicide Risk:  ?Minimal: No identifiable suicidal  ideation.  Patients presenting with no risk factors but with morbid ruminations; may be classified as minimal risk based on the severity of the depressive symptoms ? ? ? ?Plan Of Care/Follow-up recommendations:  ?Patient is denying any suicidal ideation and his affect is more upbeat.  He is denying feeling depressed.  Denies hallucinations.  Patient  will be discharged with a supply of his medicine and prescriptions and referral to outpatient treatment. ? ?Nicholas Rasmussen, MD ?09/22/2021, 10:09 AM ?

## 2021-09-22 NOTE — BHH Counselor (Signed)
Patient declined CSW assistance with scheduling aftercare appointments.  ? ?Penni Homans, MSW, LCSW ?09/22/2021 10:34 AM  ?

## 2021-09-22 NOTE — BH IP Treatment Plan (Signed)
Interdisciplinary Treatment and Diagnostic Plan Update ? ?09/22/2021 ?Time of Session: 0830 ?Nicholas Barron ?MRN: [id number] ? ?Principal Diagnosis: Schizoaffective disorder (HCC) ? ?Secondary Diagnoses: Principal Problem: ?  Schizoaffective disorder (HCC) ? ? ?Current Medications:  ?Current Facility-Administered Medications  ?Medication Dose Route Frequency Provider Last Rate Last Admin  ? acetaminophen (TYLENOL) tablet 650 mg  650 mg Oral Q6H PRN Vanetta MuldersBarthold, Louise F, NP      ? alum & mag hydroxide-simeth (MAALOX/MYLANTA) 200-200-20 MG/5ML suspension 30 mL  30 mL Oral Q4H PRN Gabriel CirriBarthold, Louise F, NP      ? benztropine (COGENTIN) tablet 0.5 mg  0.5 mg Oral BID Gabriel CirriBarthold, Louise F, NP   0.5 mg at 09/21/21 1735  ? haloperidol (HALDOL) tablet 5 mg  5 mg Oral BID Gabriel CirriBarthold, Louise F, NP   5 mg at 09/21/21 1734  ? haloperidol decanoate (HALDOL DECANOATE) 100 MG/ML injection 100 mg  100 mg Intramuscular Q30 days Clapacs, John T, MD   100 mg at 09/19/21 1414  ? hydrOXYzine (ATARAX) tablet 50 mg  50 mg Oral Q6H PRN Clapacs, Jackquline DenmarkJohn T, MD   50 mg at 09/17/21 0831  ? lithium carbonate (LITHOBID) CR tablet 300 mg  300 mg Oral Q12H Clapacs, John T, MD   300 mg at 09/21/21 2152  ? magnesium hydroxide (MILK OF MAGNESIA) suspension 30 mL  30 mL Oral Daily PRN Gabriel CirriBarthold, Louise F, NP      ? traZODone (DESYREL) tablet 100 mg  100 mg Oral QHS PRN Clapacs, Jackquline DenmarkJohn T, MD   100 mg at 09/21/21 2152  ? ?PTA Medications: ?Medications Prior to Admission  ?Medication Sig Dispense Refill Last Dose  ? benztropine (COGENTIN) 0.5 MG tablet Take 1 tablet (0.5 mg total) by mouth 2 (two) times daily. (Patient not taking: Reported on 09/15/2021) 60 tablet 1   ? haloperidol (HALDOL) 5 MG tablet TAKE 1 TABLET BY MOUTH TWO TIMES DAILY 28 tablet 0   ? haloperidol decanoate (HALDOL DECANOATE) 100 MG/ML injection Inject 1 mL (100 mg total) into the muscle every 30 (thirty) days. (Patient not taking: Reported on 09/15/2021) 1 mL 3   ? ondansetron (ZOFRAN ODT) 4 MG  disintegrating tablet Take 1 tablet (4 mg total) by mouth every 8 (eight) hours as needed for nausea or vomiting. (Patient not taking: Reported on 09/15/2021) 20 tablet 0   ? ? ?Patient Stressors: Other: patient did not identify    ? ?Patient Strengths: Physical Health  ? ?Treatment Modalities: Medication Management, Group therapy, Case management,  ?1 to 1 session with clinician, Psychoeducation, Recreational therapy. ? ? ?Physician Treatment Plan for Primary Diagnosis: Schizoaffective disorder (HCC) ?Long Term Goal(s): Improvement in symptoms so as ready for discharge  ? ?Short Term Goals: Compliance with prescribed medications will improve ?Ability to identify triggers associated with substance abuse/mental health issues will improve ?Ability to verbalize feelings will improve ?Ability to disclose and discuss suicidal ideas ?Ability to demonstrate self-control will improve ? ?Medication Management: Evaluate patient's response, side effects, and tolerance of medication regimen. ? ?Therapeutic Interventions: 1 to 1 sessions, Unit Group sessions and Medication administration. ? ?Evaluation of Outcomes: Progressing ? ?Physician Treatment Plan for Secondary Diagnosis: Principal Problem: ?  Schizoaffective disorder (HCC) ? ?Long Term Goal(s): Improvement in symptoms so as ready for discharge  ? ?Short Term Goals: Compliance with prescribed medications will improve ?Ability to identify triggers associated with substance abuse/mental health issues will improve ?Ability to verbalize feelings will improve ?Ability to disclose and discuss suicidal ideas ?Ability to demonstrate self-control  will improve    ? ?Medication Management: Evaluate patient's response, side effects, and tolerance of medication regimen. ? ?Therapeutic Interventions: 1 to 1 sessions, Unit Group sessions and Medication administration. ? ?Evaluation of Outcomes: Progressing ? ? ?RN Treatment Plan for Primary Diagnosis: Schizoaffective disorder (HCC) ?Long  Term Goal(s): Knowledge of disease and therapeutic regimen to maintain health will improve ? ?Short Term Goals: Ability to remain free from injury will improve, Ability to verbalize frustration and anger appropriately will improve, Ability to demonstrate self-control, Ability to participate in decision making will improve, Ability to verbalize feelings will improve, Ability to disclose and discuss suicidal ideas, Ability to identify and develop effective coping behaviors will improve, and Compliance with prescribed medications will improve ? ?Medication Management: RN will administer medications as ordered by provider, will assess and evaluate patient's response and provide education to patient for prescribed medication. RN will report any adverse and/or side effects to prescribing provider. ? ?Therapeutic Interventions: 1 on 1 counseling sessions, Psychoeducation, Medication administration, Evaluate responses to treatment, Monitor vital signs and CBGs as ordered, Perform/monitor CIWA, COWS, AIMS and Fall Risk screenings as ordered, Perform wound care treatments as ordered. ? ?Evaluation of Outcomes: Progressing ? ? ?LCSW Treatment Plan for Primary Diagnosis: Schizoaffective disorder (HCC) ?Long Term Goal(s): Safe transition to appropriate next level of care at discharge, Engage patient in therapeutic group addressing interpersonal concerns. ? ?Short Term Goals: Engage patient in aftercare planning with referrals and resources, Increase social support, Increase ability to appropriately verbalize feelings, Increase emotional regulation, Facilitate acceptance of mental health diagnosis and concerns, Facilitate patient progression through stages of change regarding substance use diagnoses and concerns, Identify triggers associated with mental health/substance abuse issues, and Increase skills for wellness and recovery ? ?Therapeutic Interventions: Assess for all discharge needs, 1 to 1 time with Child psychotherapist, Explore  available resources and support systems, Assess for adequacy in community support network, Educate family and significant other(s) on suicide prevention, Complete Psychosocial Assessment, Interpersonal group therapy. ? ?Evaluation of Outcomes: Progressing ? ? ?Progress in Treatment: ?Attending groups: No. ?Participating in groups: No. ?Taking medication as prescribed: Yes. ?Toleration medication: Yes. ?Family/Significant other contact made: No, will contact:  CSW will obtain consent to reach collateral.  ?Patient understands diagnosis: No. ?Discussing patient identified problems/goals with staff: No. ?Medical problems stabilized or resolved: Yes. ?Denies suicidal/homicidal ideation: Yes. ?Issues/concerns per patient self-inventory: Yes. ?Other: none  ? ?New problem(s) identified: No, Describe:  No additional problems/concerns expressed. Patient is isolative and forwards little information/details about discharge. CSW to continue to have conversations with patient to plan for discharge.  ? ?New Short Term/Long Term Goal(s): Patient to work towards detox, elimination of symptoms of psychosis, medication management for mood stabilization; development of comprehensive mental wellness/sobriety plan. ? ?Patient Goals:  No additional goals identified at this time. Patient to continue to work towards original goals identified in initial treatment team meeting. CSW will remain available to patient should they voice additional treatment goals.  ? ?Discharge Plan or Barriers: No psychosocial barriers identified at this time, patient to return to place of residence when appropriate for discharge.  ? ?Reason for Continuation of Hospitalization: Other; describe Disorganized thoughts/behaviors. ? ?Estimated Length of Stay: 1-7 days  ? ?Last 3 [place] Suicide Severity Risk Score: ?Flowsheet Row Admission (Current) from 09/16/2021 in [facility] INPATIENT BEHAVIORAL MEDICINE ED from 09/15/2021 in [facility] EMERGENCY  DEPARTMENT ED from 07/05/2021 in [facility] EMERGENCY DEPARTMENT  ?C-SSRS RISK CATEGORY Low Risk Low Risk No Risk  ? ?  ? ? ?  Last PHQ 2/9 Scores: ?   ? View : No data to display.  ?  ?

## 2021-09-22 NOTE — Progress Notes (Signed)
Patient ID: Nicholas Barron, male   DOB: 12-23-1995, 26 y.o.   MRN: [id number] ? ?Patient denies SI/HI/AVH. Belongings were returned to patient. Discharge instructions  including medication and follow up information were reviewed with patient and understanding was verbalized. Patient was not observed to be in distress at time of discharge. Patient was escorted out with staff to medical mall to be transported home by [employer].  ?

## 2021-09-22 NOTE — Plan of Care (Signed)
?  Problem: Education: ?Goal: Knowledge of [facility] Education information/materials will improve ?Outcome: Adequate for Discharge ?Goal: Emotional status will improve ?Outcome: Adequate for Discharge ?Goal: Mental status will improve ?Outcome: Adequate for Discharge ?Goal: Verbalization of understanding the information provided will improve ?Outcome: Adequate for Discharge ?  ?Problem: Activity: ?Goal: Interest or engagement in activities will improve ?Outcome: Adequate for Discharge ?Goal: Sleeping patterns will improve ?Outcome: Adequate for Discharge ?  ?Problem: Coping: ?Goal: Ability to verbalize frustrations and anger appropriately will improve ?Outcome: Adequate for Discharge ?Goal: Ability to demonstrate self-control will improve ?Outcome: Adequate for Discharge ?  ?Problem: Health Behavior/Discharge Planning: ?Goal: Identification of resources available to assist in meeting health care needs will improve ?Outcome: Adequate for Discharge ?Goal: Compliance with treatment plan for underlying cause of condition will improve ?Outcome: Adequate for Discharge ?  ?Problem: Physical Regulation: ?Goal: Ability to maintain clinical measurements within normal limits will improve ?Outcome: Adequate for Discharge ?  ?Problem: Safety: ?Goal: Periods of time without injury will increase ?Outcome: Adequate for Discharge ?  ?Problem: Activity: ?Goal: Will verbalize the importance of balancing activity with adequate rest periods ?Outcome: Adequate for Discharge ?  ?Problem: Education: ?Goal: Will be free of psychotic symptoms ?Outcome: Adequate for Discharge ?Goal: Knowledge of the prescribed therapeutic regimen will improve ?Outcome: Adequate for Discharge ?  ?Problem: Coping: ?Goal: Coping ability will improve ?Outcome: Adequate for Discharge ?Goal: Will verbalize feelings ?Outcome: Adequate for Discharge ?  ?Problem: Health Behavior/Discharge Planning: ?Goal: Compliance with prescribed medication regimen will  improve ?Outcome: Adequate for Discharge ?  ?Problem: Nutritional: ?Goal: Ability to achieve adequate nutritional intake will improve ?Outcome: Adequate for Discharge ?  ?Problem: Role Relationship: ?Goal: Ability to communicate needs accurately will improve ?Outcome: Adequate for Discharge ?Goal: Ability to interact with others will improve ?Outcome: Adequate for Discharge ?  ?Problem: Safety: ?Goal: Ability to redirect hostility and anger into socially appropriate behaviors will improve ?Outcome: Adequate for Discharge ?Goal: Ability to remain free from injury will improve ?Outcome: Adequate for Discharge ?  ?Problem: Self-Care: ?Goal: Ability to participate in self-care as condition permits will improve ?Outcome: Adequate for Discharge ?  ?Problem: Self-Concept: ?Goal: Will verbalize positive feelings about self ?Outcome: Adequate for Discharge ?  ?Problem: Group Participation ?Goal: STG - Patient will engage in groups without prompting or encouragement from LRT x3 group sessions within 5 recreation therapy group sessions ?Description: STG - Patient will engage in groups without prompting or encouragement from LRT x3 group sessions within 5 recreation therapy group sessions ?Outcome: Adequate for Discharge ?  ?

## 2021-09-22 NOTE — Progress Notes (Signed)
Recreation Therapy Notes ? ?INPATIENT RECREATION TR PLAN ? ?Patient Details ?Name: Nicholas Barron ?MRN: [id number] ?DOB: 09-07-1995 ?Today's Date: 09/22/2021 ? ?Rec Therapy Plan ?Is patient appropriate for Therapeutic Recreation?: Yes ?Treatment times per week: At least 3 ?Estimated Length of Stay: 5-7days ?TR Treatment/Interventions: Group participation (Comment) ? ?Discharge Criteria ?Pt will be discharged from therapy if:: Discharged ?Treatment plan/goals/alternatives discussed and agreed upon by:: Patient/family ? ?Discharge Summary ?Short term goals set: Patient will engage in groups without prompting or encouragement from LRT x3 group sessions within 5 recreation therapy group sessions ?Short term goals met: Complete ?Progress toward goals comments: Groups attended ?Which groups?: Self-esteem, Leisure education, Other (Comment) (Self-care, Relaxation) ?Reason goals not met: N/A ?Therapeutic equipment acquired: N/A ?Reason patient discharged from therapy: Discharge from hospital ?Pt/family agrees with progress & goals achieved: Yes ?Date patient discharged from therapy: 09/22/21 ? ? ?Maze Corniel ?09/22/2021, 12:31 PM ?

## 2021-09-22 NOTE — Discharge Summary (Signed)
Physician Discharge Summary Note ? ?Patient:  Nicholas Barron is an 26 y.o., male ?MRN:  [id number] ?DOB:  April 05, 1996 ?Patient phone:  [phone] (home)  ?Patient address:   ?[address] ?[place] [address],  ?Total Time spent with patient: 30 minutes ? ?Date of Admission:  09/16/2021 ?Date of Discharge: 09/22/2021 ? ?Reason for Admission: Patient was admitted with psychosis and suicidal statements disorganized thinking. ? ?Principal Problem: Schizoaffective disorder (HCC) ?Discharge Diagnoses: Principal Problem: ?  Schizoaffective disorder (HCC) ? ? ?Past Psychiatric History: Patient has a past history of psychosis and mood symptoms including some manic symptoms but also recurrent depressive episodes.  Previous treatment with [facility] but has been out of touch recently.  Did fairly well when on long-acting antipsychotics ? ?Past Medical History:  ?Past Medical History:  ?Diagnosis Date  ? Schizophrenia (HCC)   ? History reviewed. No pertinent surgical history. ?Family History: History reviewed. No pertinent family history. ?Family Psychiatric  History: See previous ?Social History:  ?Social History  ? ?Substance and Sexual Activity  ?Alcohol Use Yes  ?   ?Social History  ? ?Substance and Sexual Activity  ?Drug Use Not Currently  ? Types: Marijuana  ?  ?Social History  ? ?Socioeconomic History  ? Marital status: Single  ?  Spouse name: Not on file  ? Number of children: Not on file  ? Years of education: Not on file  ? Highest education level: Not on file  ?Occupational History  ? Not on file  ?Tobacco Use  ? Smoking status: Former  ? Smokeless tobacco: Never  ?Substance and Sexual Activity  ? Alcohol use: Yes  ? Drug use: Not Currently  ?  Types: Marijuana  ? Sexual activity: Yes  ?Other Topics Concern  ? Not on file  ?Social History Narrative  ? Not on file  ? ?Social Determinants of Health  ? ?Financial Resource Strain: Not on file  ?Food Insecurity: Not on file  ?Transportation Needs: Not on file   ?Physical Activity: Not on file  ?Stress: Not on file  ?Social Connections: Not on file  ? ? ?Hospital Course: Admitted to the hospital continued on 15-minute checks.  Patient did not display any dangerous nor aggressive or suicidal or violent behavior.  He remained withdrawn initially but as his symptoms improved he became more involved on the unit interacting with peers and staff more.  He was given his haloperidol decanoate shot on 16 April and has been continued on oral Haldol and Cogentin.  Also started on modest dose of lithium for what seems like more of a bipolar or schizoaffective presentation.  Medicines all tolerated with no complaints.  At the time of discharge she is more lucid and is able to articulate that he is not having any hallucinations or suicidal thoughts and agrees to follow-up treatment.  He will be discharged with a supply of medicine and prescriptions. ? ?Physical Findings: ?AIMS:  , ,  ,  ,    ?CIWA:    ?COWS:    ? ?Musculoskeletal: ?Strength & Muscle Tone: within normal limits ?Gait & Station: normal ?Patient leans: N/A ? ? ?Psychiatric Specialty Exam: ? ?Presentation  ?General Appearance: Appropriate for Environment ? ?Eye Contact:Good ? ?Speech:Clear and Coherent ? ?Speech Volume:Decreased ? ?Handedness:Right ? ? ?Mood and Affect  ?Mood:Euthymic ? ?Affect:Blunt; Congruent ? ? ?Thought Process  ?Thought Processes:Coherent ? ?Descriptions of Associations:Intact ? ?Orientation:Full (Time, Place and Person) ? ?Thought Content:Logical ? ?History of Schizophrenia/Schizoaffective disorder:Yes ? ?Duration of Psychotic Symptoms:Greater than  six months ? ?Hallucinations:No data recorded ?Ideas of Reference:None ? ?Suicidal Thoughts:No data recorded ?Homicidal Thoughts:No data recorded ? ?Sensorium  ?Memory:Immediate Fair; Recent Fair; Remote Fair ? ?Judgment:Fair ? ?Insight:Fair ? ? ?Executive Functions  ?Concentration:Fair ? ?Attention Span:Fair ? ?Recall:Fair ? ?Fund of  Knowledge:Fair ? ?Language:Fair ? ? ?Psychomotor Activity  ?Psychomotor Activity:No data recorded ? ?Assets  ?Assets:Communication Skills; Resilience; Social Support ? ? ?Sleep  ?Sleep:No data recorded ? ? ?Physical Exam: ?Physical Exam ?Vitals and nursing note reviewed.  ?Constitutional:   ?   Appearance: Normal appearance.  ?HENT:  ?   Head: Normocephalic and atraumatic.  ?   Mouth/Throat:  ?   Pharynx: Oropharynx is clear.  ?Eyes:  ?   Pupils: Pupils are equal, round, and reactive to light.  ?Cardiovascular:  ?   Rate and Rhythm: Normal rate and regular rhythm.  ?Pulmonary:  ?   Effort: Pulmonary effort is normal.  ?   Breath sounds: Normal breath sounds.  ?Abdominal:  ?   General: Abdomen is flat.  ?   Palpations: Abdomen is soft.  ?Musculoskeletal:     ?   General: Normal range of motion.  ?Skin: ?   General: Skin is warm and dry.  ?Neurological:  ?   General: No focal deficit present.  ?   Mental Status: He is alert. Mental status is at baseline.  ?Psychiatric:     ?   Attention and Perception: Attention normal.     ?   Mood and Affect: Mood normal.     ?   Speech: Speech normal.     ?   Behavior: Behavior normal.     ?   Thought Content: Thought content normal.     ?   Cognition and Memory: Cognition normal.     ?   Judgment: Judgment normal.  ? ?Review of Systems  ?Constitutional: Negative.   ?HENT: Negative.    ?Eyes: Negative.   ?Respiratory: Negative.    ?Cardiovascular: Negative.   ?Gastrointestinal: Negative.   ?Musculoskeletal: Negative.   ?Skin: Negative.   ?Neurological: Negative.   ?Psychiatric/Behavioral: Negative.    ?Blood pressure 114/74, pulse 79, temperature 97.9 ?F (36.6 ?C), temperature source Oral, resp. rate 18, height 5' 10.47" (1.79 m), weight 103.9 kg, SpO2 100 %. Body mass index is 32.42 kg/m?. ? ? ?Social History  ? ?Tobacco Use  ?Smoking Status Former  ?Smokeless Tobacco Never  ? ?Tobacco Cessation:  N/A, patient does not currently use tobacco products ? ? ?Blood Alcohol level:  ?Lab  Results  ?Component Value Date  ? ETH <10 09/15/2021  ? ETH <10 04/06/2020  ? ? ?Metabolic Disorder Labs:  ?Lab Results  ?Component Value Date  ? HGBA1C 5.2 09/17/2021  ? MPG 102.54 09/17/2021  ? MPG 102.54 09/15/2021  ? ?No results found for: PROLACTIN ?Lab Results  ?Component Value Date  ? CHOL 144 09/17/2021  ? TRIG 47 09/17/2021  ? HDL 30 (L) 09/17/2021  ? CHOLHDL 4.8 09/17/2021  ? VLDL 9 09/17/2021  ? LDLCALC 105 (H) 09/17/2021  ? LDLCALC 118 (H) 03/31/2020  ? ? ?See Psychiatric Specialty Exam and Suicide Risk Assessment completed by Attending Physician prior to discharge. ? ?Discharge destination:  Home ? ?Is patient on multiple antipsychotic therapies at discharge:  No   ?Has Patient had three or more failed trials of antipsychotic monotherapy by history:  No ? ?Recommended Plan for Multiple Antipsychotic Therapies: ?NA ? ?Discharge Instructions   ? ? Diet - low sodium heart healthy  Complete by: As directed ?  ? Increase activity slowly   Complete by: As directed ?  ? ?  ? ?Allergies as of 09/22/2021   ?No Known Allergies ?  ? ?  ?Medication List  ?  ? ?STOP taking these medications   ? ?ondansetron 4 MG disintegrating tablet ?Commonly known as: Zofran ODT ?  ? ?  ? ?TAKE these medications   ? ?  Indication  ?benztropine 0.5 MG tablet ?Commonly known as: COGENTIN ?Take 1 tablet (0.5 mg total) by mouth 2 (two) times daily. ? Indication: Extrapyramidal Reaction caused by Medications ?  ?haloperidol 5 MG tablet ?Commonly known as: HALDOL ?Take 1 tablet (5 mg total) by mouth 2 (two) times daily. ?What changed: how much to take ? Indication: Psychosis ?  ?haloperidol decanoate 100 MG/ML injection ?Commonly known as: HALDOL DECANOATE ?Inject 1 mL (100 mg total) into the muscle every 30 (thirty) days. ?Start taking on: Oct 15, 2021 ? Indication: MIXED BIPOLAR AFFECTIVE DISORDER, Schizophrenia ?  ?lithium carbonate 300 MG CR tablet ?Commonly known as: LITHOBID ?Take 1 tablet (300 mg total) by mouth every 12 (twelve)  hours. ? Indication: Depressive Phase of Manic-Depression, Schizoaffective Disorder ?  ?traZODone 100 MG tablet ?Commonly known as: DESYREL ?Take 1 tablet (100 mg total) by mouth at bedtime as needed for sleep. ? Indication: T

## 2021-09-22 NOTE — Group Note (Signed)
[facility] LCSW Group Therapy Note ? ? ?Group Date: 09/22/2021 ?Start Time: 1330 ?End Time: 1430 ? ? ?Type of Therapy/Topic:  Group Therapy:  Emotion Regulation ? ?Participation Level:  Minimal  ? ? ? ?Description of Group:   ? The purpose of this group is to assist patients in learning to regulate negative emotions and experience positive emotions. Patients will be guided to discuss ways in which they have been vulnerable to their negative emotions. These vulnerabilities will be juxtaposed with experiences of positive emotions or situations, and patients challenged to use positive emotions to combat negative ones. Special emphasis will be placed on coping with negative emotions in conflict situations, and patients will process healthy conflict resolution skills. ? ?Therapeutic Goals: ?Patient will identify two positive emotions or experiences to reflect on in order to balance out negative emotions:  ?Patient will label two or more emotions that they find the most difficult to experience:  ?Patient will be able to demonstrate positive conflict resolution skills through discussion or role plays:  ? ?Summary of Patient Progress: ? ? ?Patient was present for the entirety of group session. Patient participated in opening and closing remarks. However, patient did not contribute at all to the topic of discussion despite encouraged participation.   ? ? ? ?Therapeutic Modalities:   ?Cognitive Behavioral Therapy ?Feelings Identification ?Dialectical Behavioral Therapy ? ? ?Karalee Height, Student-Social Work ?

## 2021-09-22 NOTE — Progress Notes (Signed)
?  [facility] Adult Case Management Discharge Plan : ? ?Will you be returning to the same living situation after discharge:  Yes,  pt reports that he is returning to living with "people".  Patient declined to elaborate on these "people". ?At discharge, do you have transportation home?: Yes,  CSW to assist with transportation needs.  ?Do you have the ability to pay for your medications: No. ? ?Release of information consent forms completed and in the chart;  Patient's signature needed at discharge. ? ?Patient to Follow up at: ? Follow-up Information   ? ? [facility] Follow up.   ?Why: Walk in hours are from 8AM to 4PM Monday, Wednesday and Friday. ?Contact information: ?[address] ?[place] [address] ?[phone] ? ? ?  ?  ? ?  ?  ? ?  ? ? ?Next level of care provider has access to [facility] Link:no ? ?Safety Planning and Suicide Prevention discussed: No.  Patient was unable to provide correct information for his mother for SPE to be completed.  ? ?  ? ?Has patient been referred to the Quitline?: Patient refused referral  Patient denied that he smokes and declined referral to Quitline.  ? ?Patient has been referred for addiction treatment: Pt. refused referral ? ?Harden Mo, LCSW ?09/22/2021, 10:34 AM ?

## 2021-09-22 NOTE — Progress Notes (Signed)
Recreation Therapy Notes ? ?Date: 09/22/2021 ? ?Time: 10:50 am    ? ?Location: Craft room    ? ?Behavioral response: Appropriate ? ?Intervention Topic: Self-esteem   ? ?Discussion/Intervention:  ?Group content today was focused on self-esteem. Patient defined self-esteem and where it comes form. The group described reasons self-esteem is important. Individuals stated things that impact self-esteem and positive ways to improve self-esteem. The group participated in the intervention ?Collage of Me? where patients were able to create a collage of positive things that makes them who they are. ?Clinical Observations/Feedback: ?Patient came to group and was focused on what peers and staff had to say about self-esteem. Individual was social with peers and staff while participating in the intervention.    ?Erle Guster LRT/CTRS  ? ? ? ? ? ? ? ?Nicholas Barron ?09/22/2021 12:06 PM ?

## 2021-12-20 ENCOUNTER — Encounter: Payer: Self-pay | Admitting: *Deleted

## 2021-12-20 ENCOUNTER — Other Ambulatory Visit: Payer: Self-pay

## 2021-12-20 DIAGNOSIS — R519 Headache, unspecified: Secondary | ICD-10-CM | POA: Insufficient documentation

## 2021-12-20 DIAGNOSIS — H53149 Visual discomfort, unspecified: Secondary | ICD-10-CM | POA: Insufficient documentation

## 2021-12-20 DIAGNOSIS — R110 Nausea: Secondary | ICD-10-CM | POA: Insufficient documentation

## 2021-12-20 LAB — CBC
HCT: 41.1 % (ref 39.0–52.0)
Hemoglobin: 12.8 g/dL — ABNORMAL LOW (ref 13.0–17.0)
MCH: 25.5 pg — ABNORMAL LOW (ref 26.0–34.0)
MCHC: 31.1 g/dL (ref 30.0–36.0)
MCV: 82 fL (ref 80.0–100.0)
Platelets: 299 10*3/uL (ref 150–400)
RBC: 5.01 MIL/uL (ref 4.22–5.81)
RDW: 16.8 % — ABNORMAL HIGH (ref 11.5–15.5)
WBC: 7.5 10*3/uL (ref 4.0–10.5)
nRBC: 0 % (ref 0.0–0.2)

## 2021-12-20 LAB — BASIC METABOLIC PANEL
Anion gap: 4 — ABNORMAL LOW (ref 5–15)
BUN: 11 mg/dL (ref 6–20)
CO2: 27 mmol/L (ref 22–32)
Calcium: 8.5 mg/dL — ABNORMAL LOW (ref 8.9–10.3)
Chloride: 108 mmol/L (ref 98–111)
Creatinine, Ser: 1.05 mg/dL (ref 0.61–1.24)
GFR, Estimated: >60 mL/min (ref >60)
Glucose, Bld: 105 mg/dL — ABNORMAL HIGH (ref 70–99)
Potassium: 3 mmol/L — ABNORMAL LOW (ref 3.5–5.1)
Sodium: 139 mmol/L (ref 135–145)

## 2021-12-20 NOTE — ED Triage Notes (Signed)
Pt has a headache for 3 days.  Pt reports nausea.  No otc meds.  Pt alert  speech clear.

## 2021-12-21 ENCOUNTER — Emergency Department
Admission: EM | Admit: 2021-12-21 | Discharge: 2021-12-21 | Disposition: A | Discharge status: Home / Self Care | Payer: Medicaid Other | Attending: Emergency Medicine | Admitting: Emergency Medicine

## 2021-12-21 DIAGNOSIS — R519 Headache, unspecified: Secondary | ICD-10-CM

## 2021-12-21 MED ORDER — DROPERIDOL 2.5 MG/ML IJ SOLN
2.5000 mg | Freq: Once | INTRAMUSCULAR | Status: AC
  Administered 2021-12-21: 2.5 mg via INTRAVENOUS
  Filled 2021-12-21: qty 2

## 2021-12-21 MED ORDER — KETOROLAC TROMETHAMINE 30 MG/ML IJ SOLN
15.0000 mg | Freq: Once | INTRAMUSCULAR | Status: AC
Start: 2021-12-21 — End: 2021-12-21
  Administered 2021-12-21: 15 mg via INTRAVENOUS
  Filled 2021-12-21: qty 1

## 2021-12-21 MED ORDER — BUTALBITAL-APAP-CAFFEINE 50-325-40 MG PO TABS
2.0000 | ORAL_TABLET | Freq: Once | ORAL | Status: AC
Start: 2021-12-21 — End: 2021-12-21
  Administered 2021-12-21: 2 via ORAL
  Filled 2021-12-21: qty 2

## 2021-12-21 MED ORDER — LACTATED RINGERS IV BOLUS
1000.0000 mL | Freq: Once | INTRAVENOUS | Status: AC
  Administered 2021-12-21: 1000 mL via INTRAVENOUS

## 2021-12-21 NOTE — ED Provider Notes (Signed)
[facility] Provider Note    Event Date/Time   First MD Initiated Contact with Patient 12/21/21 [phone]     (approximate)   History   Headache   HPI  Nicholas Barron is a 26 y.o. male who presents to the ED for evaluation of Headache   I review psychiatric DC summary from 4/19.  History of schizoaffective disorder.  Often here for polysubstance or psychiatric related complaints.  He presents to the ED requesting assistance with 3 to 4 days of a severe global headache.  Reports sometimes getting these with sort of migraine headaches, but this is more severe and persistent.  Has not taken any medications at home.  Reports photophobia and nausea without emesis or abdominal pain.  No fevers, falls or syncope.   Physical Exam   Triage Vital Signs: ED Triage Vitals  Enc Vitals Group     BP 12/20/21 2218 134/78     Pulse Rate 12/20/21 2218 81     Resp 12/20/21 2218 18     Temp 12/20/21 2218 98.2 F (36.8 C)     Temp Source 12/20/21 2218 Oral     SpO2 12/20/21 2218 98 %     Weight 12/20/21 2219 220 lb (99.8 kg)     Height 12/20/21 2219 5\' 7"  (1.702 m)     Head Circumference --      Peak Flow --      Pain Score 12/20/21 2218 9     Pain Loc --      Pain Edu? --      Excl. in GC? --     Most recent vital signs: Vitals:   12/20/21 2218 12/21/21 0334  BP: 134/78 117/86  Pulse: 81 81  Resp: 18 17  Temp: 98.2 F (36.8 C) 98.2 F (36.8 C)  SpO2: 98% 100%    General: Awake, no distress.  Ambulatory, conversational CV:  Good peripheral perfusion.  Resp:  Normal effort.  Abd:  No distention.  MSK:  No deformity noted.  Neuro:  No focal deficits appreciated. Cranial nerves II through XII intact 5/5 strength and sensation in all 4 extremities Other:  No meningismus   ED Results / Procedures / Treatments   Labs (all labs ordered are listed, but only abnormal results are displayed) Labs Reviewed  BASIC METABOLIC PANEL - Abnormal; Notable for the  following components:      Result Value   Potassium 3.0 (*)    Glucose, Bld 105 (*)    Calcium 8.5 (*)    Anion gap 4 (*)    All other components within normal limits  CBC - Abnormal; Notable for the following components:   Hemoglobin 12.8 (*)    MCH 25.5 (*)    RDW 16.8 (*)    All other components within normal limits    EKG   RADIOLOGY  Official radiology report(s): No results found.  PROCEDURES and INTERVENTIONS:  Procedures  Medications - No data to display   IMPRESSION / MDM / ASSESSMENT AND PLAN / ED COURSE  I reviewed the triage vital signs and the nursing notes.  Differential diagnosis includes, but is not limited to, migraine, meningitis, encephalitis, malingering, trauma, dehydration  {Patient presents with symptoms of an acute illness or injury that is potentially life-threatening.  26 year old male presents to the ED with a few days of a bad headache without any red flag features and suitable for migraine cocktail for symptom relief and reassessment.  Reassuring vital signs and  screening blood work.  Normal CBC and metabolic panel with minimal hypokalemia.  Ordered a migraine cocktail, which he received and then promptly left, as documented below.  IV removed.  Discussed return precautions.  Clinical Course as of 12/21/21 0552  Tue Dec 21, 2021  0500 Around this time, as I am walking back to my workstation from a different patient's room, I encountered this patient wandering around the hallways.  Ask him if he is okay and he reports he is fine and that he is going to the bathroom.  Has an IV in place, but disconnected from IV fluids.  Shortly after this, nurse informs me that patient has eloped.  Apparently he did not actually go to the restroom and was try to find the exit.  He left with his IV.  I assist with nursing staff to find him in the parking lot outside of the ED where I visualized nursing staff remove his IV and placed a 2 x 2 gauze.  We advised him  that he should not drive because of the droperidol provided.  We discussed our recommendation to sit outside or call for a ride [DS]    Clinical Course User Index [DS] Delton Prairie, MD     FINAL CLINICAL IMPRESSION(S) / ED DIAGNOSES   Final diagnoses:  None     Rx / DC Orders   ED Discharge Orders     None        Note:  This document was prepared using Dragon voice recognition software and may include unintentional dictation errors.   Delton Prairie, MD 12/21/21 [phone]

## 2021-12-21 NOTE — ED Notes (Signed)
After getting IV and PO medications, pt asked other RN to pause fluids so that he could use the bathroom. This RN then saw pt walking through emergency room (ER) and when asked where pt was going pt stated, "to the bathroom." Pt then proceeded to walk to ER lobby and exit the ER. This RN alerted charge RN and first nurse. This RN intercepted pt before pt entered car lot and removed IV. Pt appeared to be in no acute distress, alert and oriented x4. Pt verbalized readiness for discharge without printed paperwork.

## 2022-03-13 ENCOUNTER — Emergency Department
Admission: EM | Admit: 2022-03-13 | Discharge: 2022-03-13 | Disposition: A | Discharge status: Home / Self Care | Payer: Self-pay | Attending: Emergency Medicine | Admitting: Emergency Medicine

## 2022-03-13 ENCOUNTER — Other Ambulatory Visit: Payer: Self-pay

## 2022-03-13 ENCOUNTER — Emergency Department: Payer: Self-pay

## 2022-03-13 ENCOUNTER — Encounter: Payer: Self-pay | Admitting: Emergency Medicine

## 2022-03-13 DIAGNOSIS — R112 Nausea with vomiting, unspecified: Secondary | ICD-10-CM | POA: Insufficient documentation

## 2022-03-13 DIAGNOSIS — R1084 Generalized abdominal pain: Secondary | ICD-10-CM | POA: Insufficient documentation

## 2022-03-13 DIAGNOSIS — R197 Diarrhea, unspecified: Secondary | ICD-10-CM | POA: Insufficient documentation

## 2022-03-13 LAB — COMPREHENSIVE METABOLIC PANEL
ALT: 12 U/L (ref 0–44)
AST: 25 U/L (ref 15–41)
Albumin: 4.1 g/dL (ref 3.5–5.0)
Alkaline Phosphatase: 56 U/L (ref 38–126)
Anion gap: 5 (ref 5–15)
BUN: 19 mg/dL (ref 6–20)
CO2: 26 mmol/L (ref 22–32)
Calcium: 9.1 mg/dL (ref 8.9–10.3)
Chloride: 107 mmol/L (ref 98–111)
Creatinine, Ser: 1.02 mg/dL (ref 0.61–1.24)
GFR, Estimated: >60 mL/min (ref >60)
Glucose, Bld: 128 mg/dL — ABNORMAL HIGH (ref 70–99)
Potassium: 3.2 mmol/L — ABNORMAL LOW (ref 3.5–5.1)
Sodium: 138 mmol/L (ref 135–145)
Total Bilirubin: 1 mg/dL (ref 0.3–1.2)
Total Protein: 7 g/dL (ref 6.5–8.1)

## 2022-03-13 LAB — CBC
HCT: 38.5 % — ABNORMAL LOW (ref 39.0–52.0)
Hemoglobin: 12.2 g/dL — ABNORMAL LOW (ref 13.0–17.0)
MCH: 26.5 pg (ref 26.0–34.0)
MCHC: 31.7 g/dL (ref 30.0–36.0)
MCV: 83.7 fL (ref 80.0–100.0)
Platelets: 282 10*3/uL (ref 150–400)
RBC: 4.6 MIL/uL (ref 4.22–5.81)
RDW: 15.4 % (ref 11.5–15.5)
WBC: 9.6 10*3/uL (ref 4.0–10.5)
nRBC: 0 % (ref 0.0–0.2)

## 2022-03-13 LAB — URINALYSIS, ROUTINE W REFLEX MICROSCOPIC
Bacteria, UA: NONE SEEN
Bilirubin Urine: NEGATIVE
Glucose, UA: NEGATIVE mg/dL
Hgb urine dipstick: NEGATIVE
Ketones, ur: 5 mg/dL — AB
Leukocytes,Ua: NEGATIVE
Nitrite: NEGATIVE
Protein, ur: 30 mg/dL — AB
Specific Gravity, Urine: 1.029 (ref 1.005–1.030)
Squamous Epithelial / HPF: NONE SEEN (ref 0–5)
pH: 5 (ref 5.0–8.0)

## 2022-03-13 LAB — LITHIUM LEVEL: Lithium Lvl: <0.06 mmol/L — ABNORMAL LOW (ref 0.60–1.20)

## 2022-03-13 LAB — LIPASE, BLOOD: Lipase: 30 U/L (ref 11–51)

## 2022-03-13 MED ORDER — IOHEXOL 300 MG/ML  SOLN
100.0000 mL | Freq: Once | INTRAMUSCULAR | Status: AC | PRN
  Administered 2022-03-13: 100 mL via INTRAVENOUS

## 2022-03-13 MED ORDER — ONDANSETRON HCL 4 MG/2ML IJ SOLN
4.0000 mg | INTRAMUSCULAR | Status: AC
Start: 2022-03-13 — End: 2022-03-13
  Administered 2022-03-13: 4 mg via INTRAVENOUS
  Filled 2022-03-13: qty 2

## 2022-03-13 MED ORDER — POTASSIUM CHLORIDE CRYS ER 20 MEQ PO TBCR
40.0000 meq | EXTENDED_RELEASE_TABLET | Freq: Once | ORAL | Status: AC
  Administered 2022-03-13: 40 meq via ORAL
  Filled 2022-03-13: qty 2

## 2022-03-13 MED ORDER — KETOROLAC TROMETHAMINE 30 MG/ML IJ SOLN
15.0000 mg | Freq: Once | INTRAMUSCULAR | Status: AC
  Administered 2022-03-13: 15 mg via INTRAVENOUS
  Filled 2022-03-13: qty 1

## 2022-03-13 NOTE — ED Notes (Signed)
Pt to CT

## 2022-03-13 NOTE — ED Notes (Signed)
Pt back from CT

## 2022-03-13 NOTE — Discharge Instructions (Signed)

## 2022-03-13 NOTE — ED Triage Notes (Signed)
FIRST NURSE NOTE:  Pt arrived via [place] [employer], pt c/o abdominal pain at registration

## 2022-03-13 NOTE — ED Notes (Signed)
Pt tolerated sprite and graham crackers.

## 2022-03-13 NOTE — ED Triage Notes (Signed)
Pt arrived via PD with reports of abd pain x several days with N/V/D.    Pt denies any SI/HI at this time. Pt states [facility] just gave him a ride to ED.

## 2022-03-13 NOTE — ED Provider Notes (Signed)
[facility] Provider Note    Event Date/Time   First MD Initiated Contact with Patient 03/13/22 0330     (approximate)   History   Abdominal Pain   HPI Level 5 caveat: History is limited by the patient being a vague historian.  Nicholas Barron is a 26 y.o. male well-known to the emergency department for prior visits typically due to psychiatric issues.  He has a documented history of cannabis abuse, medication noncompliance, and schizophrenia versus schizoaffective disorder.  He presents tonight by Patent [employer] (although reportedly the long [employer] were just giving him a ride) for evaluation of generalized abdominal pain with nausea, vomiting, and diarrhea.  He states that this has been going on for several days but he just got tired of it so he decided to get checked out.  He says that nothing in particular makes it better or worse.  It is not worse after he eats.  He said he has been eating and drinking and he has a nearly empty large [employer] cup with him.  He otherwise is denying any symptoms and appears quite sleepy (is currently about 4:00 in the morning).  He admits to smoking cigarettes but denies any current drug use.  He denies taking any other medications and denies drinking alcohol.     Physical Exam   Triage Vital Signs: ED Triage Vitals [03/13/22 0319]  Enc Vitals Group     BP 112/79     Pulse Rate 83     Resp 18     Temp 98.3 F (36.8 C)     Temp Source Oral     SpO2 95 %     Weight 90.7 kg (200 lb)     Height 1.702 m (5\' 7" )     Head Circumference      Peak Flow      Pain Score 10     Pain Loc      Pain Edu?      Excl. in GC?     Most recent vital signs: Vitals:   03/13/22 0319 03/13/22 0445  BP: 112/79 124/83  Pulse: 83 83  Resp: 18 17  Temp: 98.3 F (36.8 C)   SpO2: 95% 95%     General: Awake, no distress.  CV:  Good peripheral perfusion.  Resp:  Normal effort.  Abd:  No distention.  Generalized mild  tenderness to palpation throughout with no rebound or guarding.  No peritonitis. Other:  Very subdued and blunted affect.  Protecting airway, somnolence does not appear pathologic but rather just situational.  Cannot exclude the possibility of intoxicating substance, but not to a dangerous level at this point.   ED Results / Procedures / Treatments   Labs (all labs ordered are listed, but only abnormal results are displayed) Labs Reviewed  COMPREHENSIVE METABOLIC PANEL - Abnormal; Notable for the following components:      Result Value   Potassium 3.2 (*)    Glucose, Bld 128 (*)    All other components within normal limits  CBC - Abnormal; Notable for the following components:   Hemoglobin 12.2 (*)    HCT 38.5 (*)    All other components within normal limits  URINALYSIS, ROUTINE W REFLEX MICROSCOPIC - Abnormal; Notable for the following components:   Color, Urine YELLOW (*)    APPearance CLEAR (*)    Ketones, ur 5 (*)    Protein, ur 30 (*)    All other components within normal limits  LITHIUM LEVEL - Abnormal; Notable for the following components:   Lithium Lvl <0.06 (*)    All other components within normal limits  LIPASE, BLOOD     EKG  ED ECG REPORT I, Hinda Kehr, the attending physician, personally viewed and interpreted this ECG.  Date: 03/13/2022 EKG Time: 3:24 AM Rate: 84 Rhythm: normal sinus rhythm QRS Axis: normal Intervals: normal ST/T Wave abnormalities: normal Narrative Interpretation: no evidence of acute ischemia    RADIOLOGY As documented below, there is no evidence of an acute or emergent medical condition identified on his CT abdomen/pelvis.   PROCEDURES:  Critical Care performed: No  Procedures   MEDICATIONS ORDERED IN ED: Medications  ketorolac (TORADOL) 30 MG/ML injection 15 mg (15 mg Intravenous Given 03/13/22 0410)  ondansetron (ZOFRAN) injection 4 mg (4 mg Intravenous Given 03/13/22 0410)  iohexol (OMNIPAQUE) 300 MG/ML solution 100  mL (100 mLs Intravenous Contrast Given 03/13/22 0431)  potassium chloride SA (KLOR-CON M) CR tablet 40 mEq (40 mEq Oral Given 03/13/22 6378)     IMPRESSION / MDM / ASSESSMENT AND PLAN / ED COURSE  I reviewed the triage vital signs and the nursing notes.                              Differential diagnosis includes, but is not limited to, medication or drug side effect, electrolyte or metabolic abnormality, appendicitis, epiploic appendagitis, mesenteric adenitis, physical manifestation of psychiatric illness, cannabinoid hyperemesis syndrome, cyclic vomiting syndrome.  Patient's presentation is most consistent with acute presentation with potential threat to life or bodily function.  Labs/studies ordered: CMP, lipase, urinalysis, CBC.  Patient is a vague historian and his chronic and well-documented mental illness may also make it difficult for him to communicate what he is feeling.  He is reporting generalized abdominal pain with nausea, vomiting, and diarrhea.  I see no recent ED visits with the symptoms and he does not appear to have a history of cyclic vomiting syndrome although it may be further back in his chart.  Labs are all essentially normal other than a very slightly decreased potassium.  However given his report of generalized tenderness I think it is reasonable to order a CT of the abdomen pelvis with IV contrast to rule out any intra-abdominal emergencies.  Patient says he agrees with the plan.  He appears quite somnolent right now and does not seem to need any analgesia.   Clinical Course as of 03/13/22 0636  Sun Mar 13, 2022  0503 CT ABDOMEN PELVIS W CONTRAST I viewed and interpreted the patient's CT scan of the abdomen pelvis I see no evidence of an acute or emergent condition.  I also read the radiologist's report, which confirmed no acute findings. [CF]  [id number] The patient has been sleeping comfortably.  I woke him up to let him know that his results are all normal.  There is  no indication for admission.  I ordered 40 mill equivalents of potassium for him to have prior to discharge.  I explained he is to follow-up as an outpatient and that there is no reason to stay in the hospital.  He said he understands and agrees with the plan for discharge.  I gave my usual and customary return precautions. [CF]  0602 I was going to discharge the patient but as I was looking over everything again, I realized that he has been prescribed lithium.  At least some of the symptoms, such as  the lethargy, nausea, and vomiting, could be attributable to lithium toxicity.  I think it is reasonable to add on a lithium level prior to discharge to make sure it is not above the upper limit of normal.  I called the lab and verify that they have what they need.  They will start processing the specimen.  Once it comes back hopefully within normal limits, he can be discharged. [CF]  [id number] Lithium(!): <0.06 Negative lithium level which most likely represents nonadherence to his prescriptions.  However the concern is that he was lithium toxic, so he is still appropriate for discharge.  It is unfortunately his decision if he is unwilling or unable to take his medications and this will have to be something he decides as an outpatient. [CF]    Clinical Course User Index [CF] Loleta Rose, MD     FINAL CLINICAL IMPRESSION(S) / ED DIAGNOSES   Final diagnoses:  Generalized abdominal pain  Nausea vomiting and diarrhea     Rx / DC Orders   ED Discharge Orders     None        Note:  This document was prepared using Dragon voice recognition software and may include unintentional dictation errors.   Loleta Rose, MD 03/13/22 [phone]

## 2022-03-24 ENCOUNTER — Other Ambulatory Visit: Payer: Self-pay

## 2022-03-24 ENCOUNTER — Encounter: Payer: Self-pay | Admitting: Emergency Medicine

## 2022-03-24 ENCOUNTER — Emergency Department
Admission: EM | Admit: 2022-03-24 | Discharge: 2022-03-24 | Disposition: A | Discharge status: Home / Self Care | Payer: Medicaid Other | Attending: Emergency Medicine | Admitting: Emergency Medicine

## 2022-03-24 DIAGNOSIS — M79672 Pain in left foot: Secondary | ICD-10-CM | POA: Insufficient documentation

## 2022-03-24 DIAGNOSIS — Z5900 Homelessness unspecified: Secondary | ICD-10-CM | POA: Insufficient documentation

## 2022-03-24 DIAGNOSIS — M79671 Pain in right foot: Secondary | ICD-10-CM | POA: Insufficient documentation

## 2022-03-24 MED ORDER — IBUPROFEN 800 MG PO TABS
800.0000 mg | ORAL_TABLET | Freq: Once | ORAL | Status: AC
  Administered 2022-03-24: 800 mg via ORAL
  Filled 2022-03-24: qty 1

## 2022-03-24 NOTE — ED Provider Notes (Signed)
[facility] Provider Note    None    (approximate)   History   Leg Pain   HPI  Nicholas Barron is a 26 y.o. male with history of schizophrenia, homelessness who presents to the emergency department with bilateral foot and leg pain.  States he is walking a lot because he is homeless.  Denies any injury to the leg.  No history of DVT.  No fevers, chest pain or shortness of breath.  He ambulates without difficulty.  Has not taken any medications for pain.  Patient states he asked the police for right here.  No current psychiatric concerns.   History provided by patient.    Past Medical History:  Diagnosis Date   Schizophrenia [facility])     History reviewed. No pertinent surgical history.  MEDICATIONS:  Prior to Admission medications   Medication Sig Start Date End Date Taking? Authorizing Provider  benztropine (COGENTIN) 0.5 MG tablet Take 1 tablet (0.5 mg total) by mouth 2 (two) times daily. 09/22/21   Clapacs, Jackquline Denmark, MD  haloperidol (HALDOL) 5 MG tablet Take 1 tablet (5 mg total) by mouth 2 (two) times daily. 09/22/21   Clapacs, Jackquline Denmark, MD  haloperidol decanoate (HALDOL DECANOATE) 100 MG/ML injection Inject 1 mL (100 mg total) into the muscle every 30 (thirty) days. 10/15/21   Clapacs, Jackquline Denmark, MD  lithium carbonate (LITHOBID) 300 MG CR tablet Take 1 tablet (300 mg total) by mouth every 12 (twelve) hours. 09/22/21   Clapacs, Jackquline Denmark, MD  traZODone (DESYREL) 100 MG tablet Take 1 tablet (100 mg total) by mouth at bedtime as needed for sleep. 09/22/21   Clapacs, Jackquline Denmark, MD    Physical Exam   Triage Vital Signs: ED Triage Vitals  Enc Vitals Group     BP 03/24/22 0443 136/85     Pulse Rate 03/24/22 0443 72     Resp 03/24/22 0443 20     Temp 03/24/22 0443 98.4 F (36.9 C)     Temp Source 03/24/22 0443 Oral     SpO2 03/24/22 0443 100 %     Weight 03/24/22 0442 200 lb (90.7 kg)     Height 03/24/22 0442 5\' 9"  (1.753 m)     Head Circumference --      Peak Flow  --      Pain Score 03/24/22 0442 10     Pain Loc --      Pain Edu? --      Excl. in GC? --     Most recent vital signs: Vitals:   03/24/22 0443  BP: 136/85  Pulse: 72  Resp: 20  Temp: 98.4 F (36.9 C)  SpO2: 100%    CONSTITUTIONAL: Alert and oriented and responds appropriately to questions. Well-appearing; well-nourished HEAD: Normocephalic, atraumatic EYES: Conjunctivae clear, pupils appear equal, sclera nonicteric ENT: normal nose; moist mucous membranes NECK: Supple, normal ROM CARD: RRR; S1 and S2 appreciated; no murmurs, no clicks, no rubs, no gallops RESP: Normal chest excursion without splinting or tachypnea; breath sounds clear and equal bilaterally; no wheezes, no rhonchi, no rales, no hypoxia or respiratory distress, speaking full sentences ABD/GI: Normal bowel sounds; non-distended; soft, non-tender, no rebound, no guarding, no peritoneal signs BACK: The back appears normal EXT: Patient has minimal soft tissue swelling to the distal ankles and feet bilaterally without asymmetry.  No calf tenderness or calf swelling.  He has 2+ DP pulses bilaterally.  Normal capillary refill.  No bony deformity or bony abnormality  noted.  No joint effusions.  Compartments of the legs are soft.  He ambulates with a normal gait.  He does not have any sign of skin breakdown on exam.  His socks are slightly damp to touch.  He is wearing tennis shoes SKIN: Normal color for age and race; warm; no rash on exposed skin NEURO: Moves all extremities equally, normal speech PSYCH: The patient's mood and manner are appropriate.  Not responding to internal stimuli.   ED Results / Procedures / Treatments   LABS: (all labs ordered are listed, but only abnormal results are displayed) Labs Reviewed - No data to display   EKG:  RADIOLOGY: My personal review and interpretation of imaging:    I have personally reviewed all radiology reports.   No results found.   PROCEDURES:  Critical Care  performed: No      Procedures    IMPRESSION / MDM / ASSESSMENT AND PLAN / ED COURSE  I reviewed the triage vital signs and the nursing notes.    Patient here with bilateral foot pain.  The patient is on the cardiac monitor to evaluate for evidence of arrhythmia and/or significant heart rate changes.   DIFFERENTIAL DIAGNOSIS (includes but not limited to):   Foot pain likely from standing and walking and increased amount.  No signs of fracture, dislocation, cellulitis, compartment syndrome, arterial obstruction, DVT, open wounds today.   Patient's presentation is most consistent with acute, uncomplicated illness.   PLAN: Patient here with homelessness and having bilateral foot and ankle pain.  No emergent condition present on exam.  Recommended Tylenol, Motrin.  We will provide him with dry socks.  Recommended trying to pick up compression stockings over-the-counter if he can which will help with swelling and discomfort and trying to rest as much as possible and keep his legs elevated.  I did provide him with resources for local homeless shelters.  No current psychiatric safety concerns today.  Will give ibuprofen here in the ED.   MEDICATIONS GIVEN IN ED: Medications  ibuprofen (ADVIL) tablet 800 mg (800 mg Oral Given 03/24/22 0998)     ED COURSE:  At this time, I do not feel there is any life-threatening condition present. I reviewed all nursing notes, vitals, pertinent previous records.  All lab and urine results, EKGs, imaging ordered have been independently reviewed and interpreted by myself.  I reviewed all available radiology reports from any imaging ordered this visit.  Based on my assessment, I feel the patient is safe to be discharged home without further emergent workup and can continue workup as an outpatient as needed. Discussed all findings, treatment plan as well as usual and customary return precautions.  They verbalize understanding and are comfortable with this  plan.  Outpatient follow-up has been provided as needed.  All questions have been answered.    CONSULTS:  none   OUTSIDE RECORDS REVIEWED: Reviewed patient's last psychiatric admission in April 2023.       FINAL CLINICAL IMPRESSION(S) / ED DIAGNOSES   Final diagnoses:  Bilateral foot pain  Homelessness     Rx / DC Orders   ED Discharge Orders     None        Note:  This document was prepared using Dragon voice recognition software and may include unintentional dictation errors.   Jenella Craigie, Delice Bison, DO 03/24/22 [phone]

## 2022-03-24 NOTE — ED Triage Notes (Signed)
Patient ambulatory to triage with steady gait, without difficulty or distress noted; pt reports bilat leg pain for several days; denies any injury

## 2022-03-24 NOTE — Discharge Instructions (Addendum)
You may alternate Tylenol 1000 mg every 6 hours as needed for pain, fever and Ibuprofen 800 mg every 6-8 hours as needed for pain, fever.  Please take Ibuprofen with food.  Do not take more than 4000 mg of Tylenol (acetaminophen) in a 24 hour period.   I recommend trying to keep on clean and dry shoes and socks and elevate your legs and rest as much as possible.   You may purchase compression socks at [facility] to help with pain, swelling.   Steps to find a Primary Care Provider (PCP):  Call [phone] or [phone] to access "[facility] [place] [facility] Service."  2.  You may also go on the [facility] website at [email or web address]

## 2022-07-19 ENCOUNTER — Encounter: Payer: Self-pay | Admitting: Emergency Medicine

## 2022-07-19 ENCOUNTER — Emergency Department
Admission: EM | Admit: 2022-07-19 | Discharge: 2022-07-19 | Disposition: A | Discharge status: Home / Self Care | Payer: Medicaid Other | Attending: Emergency Medicine | Admitting: Emergency Medicine

## 2022-07-19 ENCOUNTER — Other Ambulatory Visit: Payer: Self-pay

## 2022-07-19 DIAGNOSIS — M79672 Pain in left foot: Secondary | ICD-10-CM | POA: Insufficient documentation

## 2022-07-19 DIAGNOSIS — M79671 Pain in right foot: Secondary | ICD-10-CM | POA: Diagnosis not present

## 2022-07-19 DIAGNOSIS — Z5900 Homelessness unspecified: Secondary | ICD-10-CM

## 2022-07-19 MED ORDER — IBUPROFEN 800 MG PO TABS
800.0000 mg | ORAL_TABLET | Freq: Once | ORAL | Status: DC
  Filled 2022-07-19: qty 1

## 2022-07-19 NOTE — Discharge Instructions (Addendum)
You may alternate Tylenol 1000 mg every 6 hours as needed for pain, fever and Ibuprofen 800 mg every 6-8 hours as needed for pain, fever.  Please take Ibuprofen with food.  Do not take more than 4000 mg of Tylenol (acetaminophen) in a 24 hour period.   Please go to the following website to schedule new (and existing) patient appointments:   [email or web address]   The following is a list of primary care offices in the area who are accepting new patients at this time.  Please reach out to one of them directly and let them know you would like to schedule an appointment to follow up on an Emergency Department visit, and/or to establish a new primary care provider (PCP).  There are likely other primary care clinics in the are who are accepting new patients, but this is an excellent place to start:  [place] [place] physician: Dr Lavon Paganini [address] [place], [place] [address] [phone]  [facility] Lead Physician: Dr Steele Sizer [address], [place], [place] [address] [phone]  [place] Physician: Dr Park Liter [address] [place], [place] [address] [phone]  [facility] Lead Physician: Dr Dewaine Oats [address] [place], [place] [address] [phone]  [facility] Physician: Dr Halina Maidens [address] [place], [place], [place] [address] [phone]

## 2022-07-19 NOTE — ED Triage Notes (Signed)
Pt to ED via EMS picked up outside the homeless shelter c/o bilateral foot pain "for a while" that isn't going away.  Denies swelling or wounds.  Pt A&Ox4, chest rise even and unlabored, skin WNL and in NAD at this time.

## 2022-07-19 NOTE — ED Provider Notes (Signed)
[facility] Provider Note    Event Date/Time   First MD Initiated Contact with Patient 07/19/22 0303     (approximate)   History   Foot Pain   HPI  Nicholas Barron is a 27 y.o. male history of schizophrenia, homelessness who presents to the emergency department complaints of bilateral foot pain ongoing for months.  Patient states he has to walk and stand because he has nowhere to stay.  No injury.  Has had x-rays of his feet in January at [facility]  Frequently presents to the ED at [facility] or here at [facility] for the same.  No other acute complaints.   History provided by patient.    Past Medical History:  Diagnosis Date   Schizophrenia [facility])     History reviewed. No pertinent surgical history.  MEDICATIONS:  Prior to Admission medications   Medication Sig Start Date End Date Taking? Authorizing Provider  benztropine (COGENTIN) 0.5 MG tablet Take 1 tablet (0.5 mg total) by mouth 2 (two) times daily. 09/22/21   Clapacs, Madie Reno, MD  haloperidol (HALDOL) 5 MG tablet Take 1 tablet (5 mg total) by mouth 2 (two) times daily. 09/22/21   Clapacs, Madie Reno, MD  haloperidol decanoate (HALDOL DECANOATE) 100 MG/ML injection Inject 1 mL (100 mg total) into the muscle every 30 (thirty) days. 10/15/21   Clapacs, Madie Reno, MD  lithium carbonate (LITHOBID) 300 MG CR tablet Take 1 tablet (300 mg total) by mouth every 12 (twelve) hours. 09/22/21   Clapacs, Madie Reno, MD  traZODone (DESYREL) 100 MG tablet Take 1 tablet (100 mg total) by mouth at bedtime as needed for sleep. 09/22/21   Clapacs, Madie Reno, MD    Physical Exam   Triage Vital Signs: ED Triage Vitals [07/19/22 0013]  Enc Vitals Group     BP 123/78     Pulse Rate 71     Resp 14     Temp 98.9 F (37.2 C)     Temp Source Oral     SpO2 100 %     Weight 170 lb (77.1 kg)     Height 5' 9"$  (1.753 m)     Head Circumference      Peak Flow      Pain Score 10     Pain Loc      Pain Edu?      Excl. in [place]?     Most recent vital  signs: Vitals:   07/19/22 0013  BP: 123/78  Pulse: 71  Resp: 14  Temp: 98.9 F (37.2 C)  SpO2: 100%     CONSTITUTIONAL: Alert and responds appropriately to questions. Well-appearing; well-nourished HEAD: Normocephalic, atraumatic EYES: Conjunctivae clear, pupils appear equal ENT: normal nose; moist mucous membranes NECK: Normal range of motion CARD: Regular rate and rhythm RESP: Normal chest excursion without splinting or tachypnea; no hypoxia or respiratory distress, speaking full sentences ABD/GI: non-distended EXT: Normal ROM in all joints, no major deformities noted, 2+ DP pulses bilaterally, no calf tenderness or calf swelling, compartments of the legs are soft, normal sensation, normal capillary refill SKIN: Normal color for age and race, no rashes on exposed skin NEURO: Moves all extremities equally, normal speech, no facial asymmetry noted PSYCH: The patient's mood and manner are appropriate.  Does not appear to be responding to internal stimuli.  Grooming and personal hygiene are appropriate.  ED Results / Procedures / Treatments   LABS: (all labs ordered are listed, but only abnormal results are displayed)  Labs Reviewed - No data to display   EKG:      RADIOLOGY: My personal review and interpretation of imaging:    I have personally reviewed all radiology reports. No results found.   PROCEDURES:  Critical Care performed: No      Procedures    IMPRESSION / MDM / ASSESSMENT AND PLAN / ED COURSE  I reviewed the triage vital signs and the nursing notes.   Patient with chronic bilateral foot pain and homelessness.     DIFFERENTIAL DIAGNOSIS (includes but not limited to):   Plantar fasciitis, tendinitis, doubt fracture, dislocation, gout, septic arthritis, cellulitis, DVT, arterial obstruction  Patient's presentation is most consistent with acute, uncomplicated illness.  PLAN: No indication for repeat imaging today.  X-rays a month ago showed  no acute abnormality when [facility] records reviewed.  Denies any new injury.  Neurovascular intact distally.  Ambulates with steady gait.  Will give ibuprofen and something to eat and drink here.  Will discharge with PCP follow-up, resources for shelters.   MEDICATIONS GIVEN IN ED: Medications  ibuprofen (ADVIL) tablet 800 mg (has no administration in time range)     ED COURSE:  At this time, I do not feel there is any life-threatening condition present. I reviewed all nursing notes, vitals, pertinent previous records.  All lab and urine results, EKGs, imaging ordered have been independently reviewed and interpreted by myself.  I reviewed all available radiology reports from any imaging ordered this visit.  Based on my assessment, I feel the patient is safe to be discharged home without further emergent workup and can continue workup as an outpatient as needed. Discussed all findings, treatment plan as well as usual and customary return precautions.  They verbalize understanding and are comfortable with this plan.  Outpatient follow-up has been provided as needed.  All questions have been answered.    CONSULTS:  none   OUTSIDE RECORDS REVIEWED:     Xrays at [facility] 06/19/21:  FINDINGS:  Left foot: No acute fracture. Small osseous fragments adjacent to the mid/hindfoot visualized on oblique view, favor chronic. No malalignment. The joint spaces are preserved.   Right foot: No acute fracture. No malalignment. The joint spaces are preserved.   Soft tissues unremarkable without radiopaque foreign body.    FINAL CLINICAL IMPRESSION(S) / ED DIAGNOSES   Final diagnoses:  Bilateral foot pain  Homelessness     Rx / DC Orders   ED Discharge Orders     None        Note:  This document was prepared using Dragon voice recognition software and may include unintentional dictation errors.   Ximena Todaro, Delice Bison, DO 07/19/22 [phone]

## 2022-08-20 IMAGING — US US EXTREM LOW VENOUS
1 series · 13 of 24 positions shown · non-contrast
Comparison: None.

CLINICAL DATA: Bilateral leg pain and swelling for several days



[Series 1: us venous img lower bilat (dvt) · portal-venous · 13 of 58 slices shown]
[im 1/58]
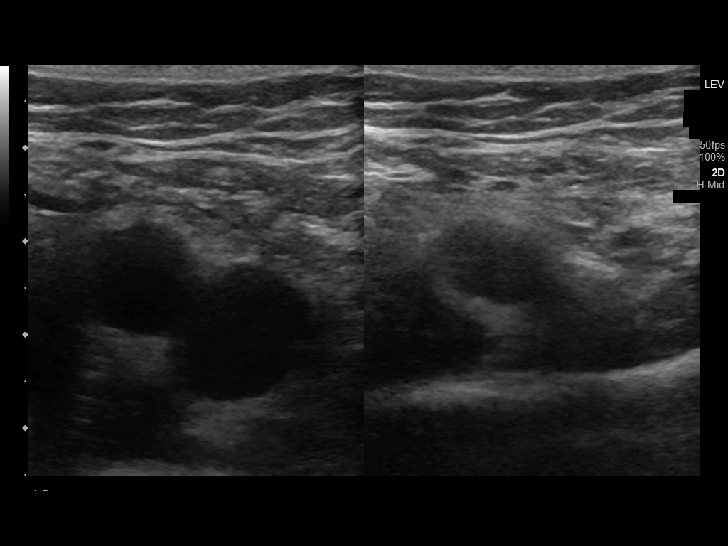
[im 5/58]
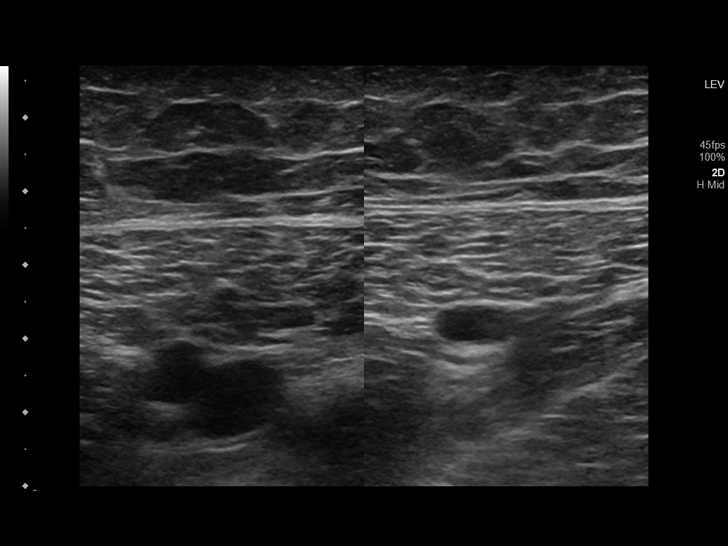
[im 10/58]
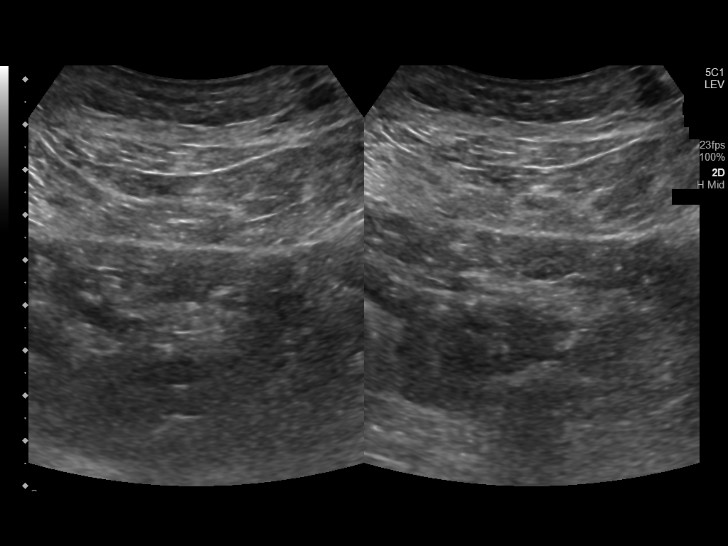
[im 15/58]
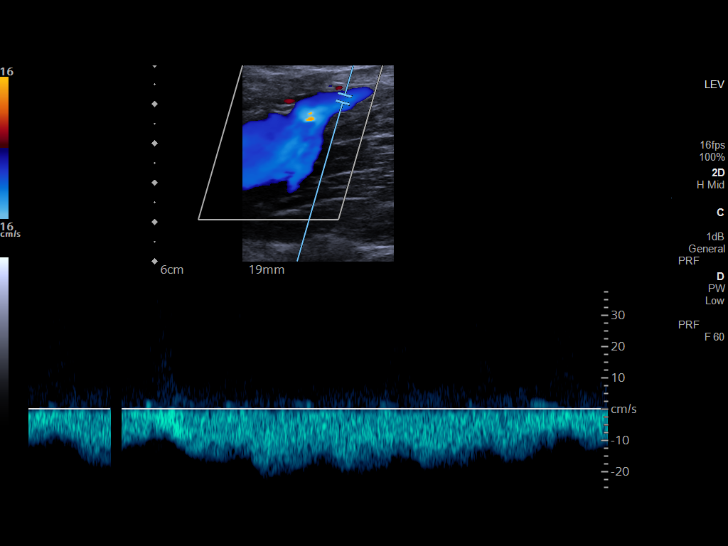
[im 20/58]
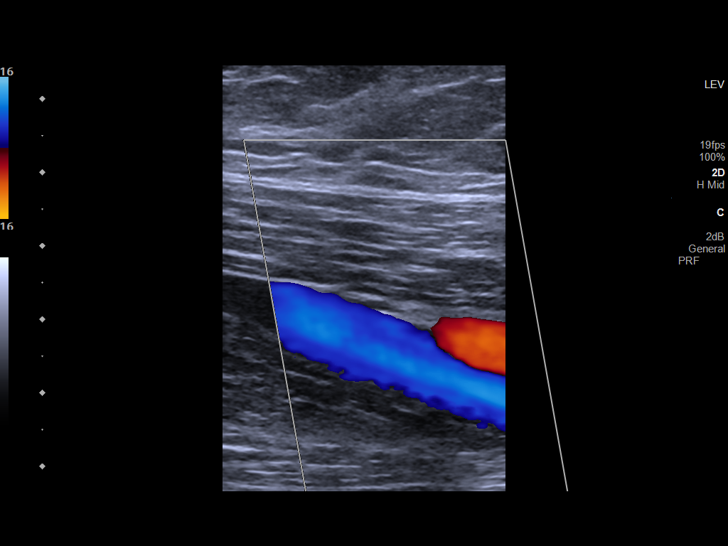
[im 25/58]
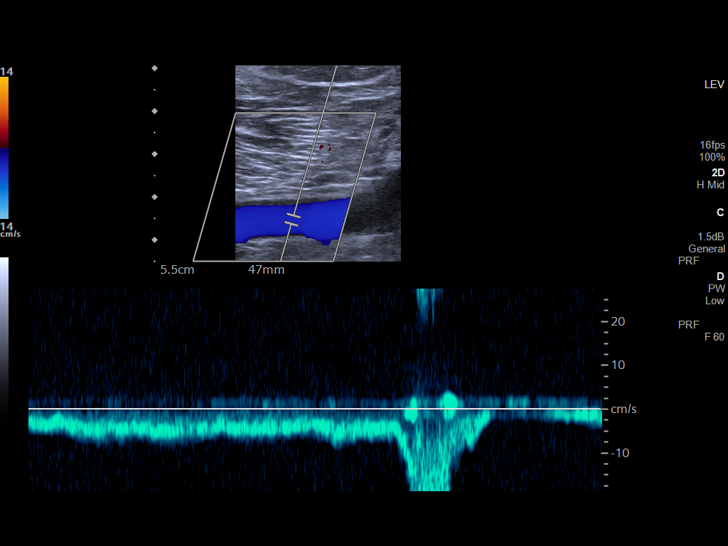
[im 30/58]
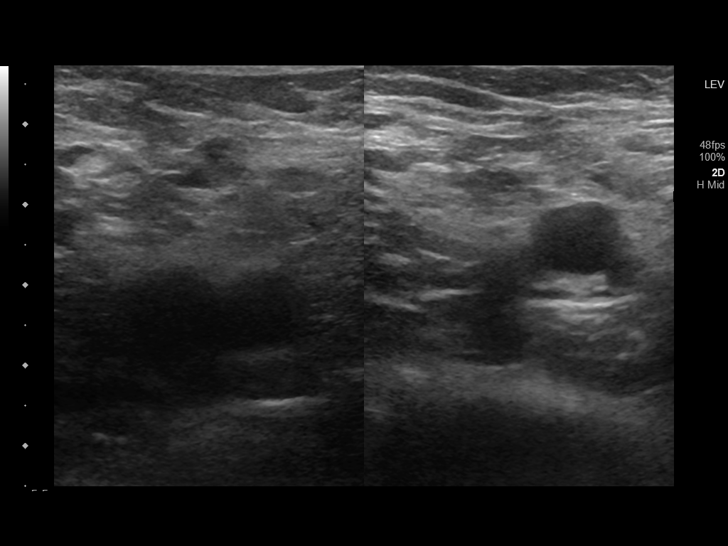
[im 33/58]
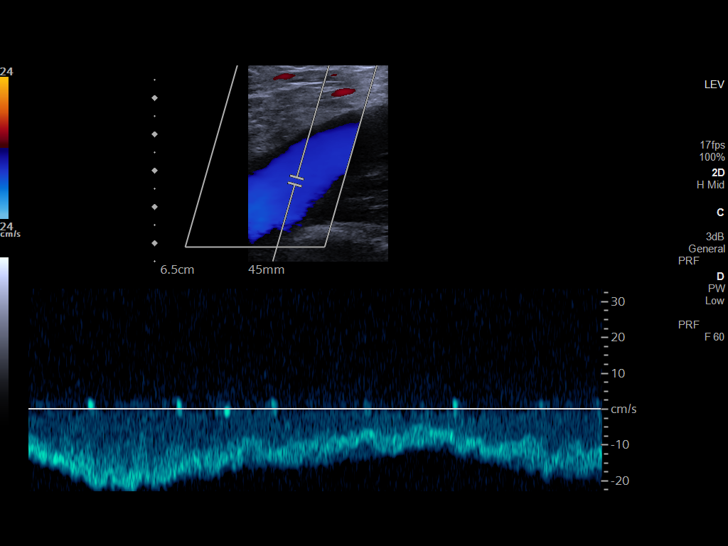
[im 38/58]
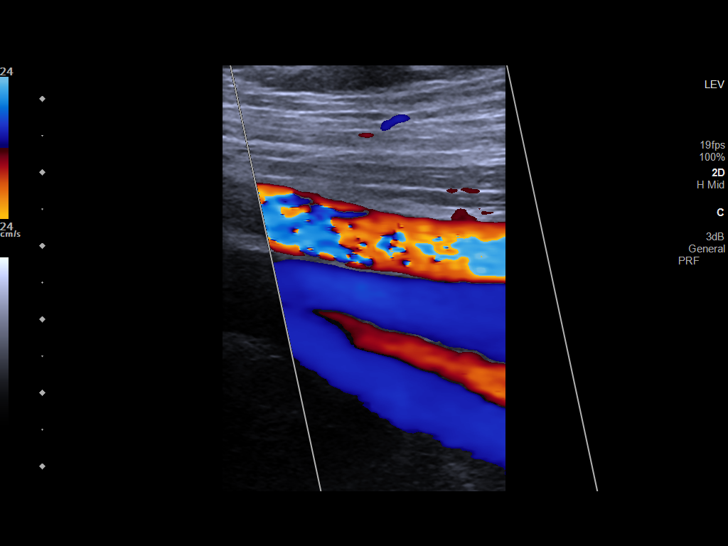
[im 43/58]
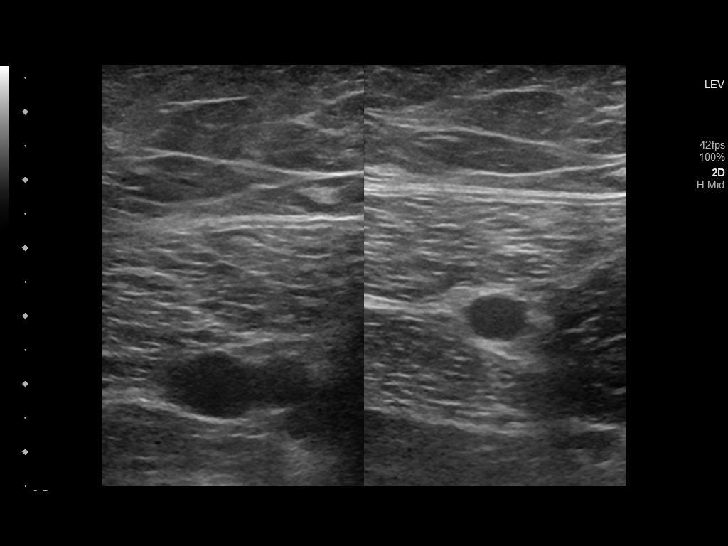
[im 48/58]
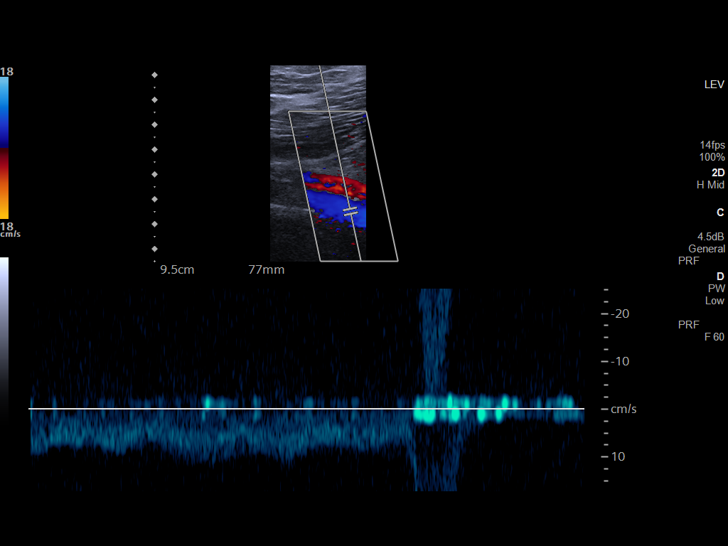
[im 53/58]
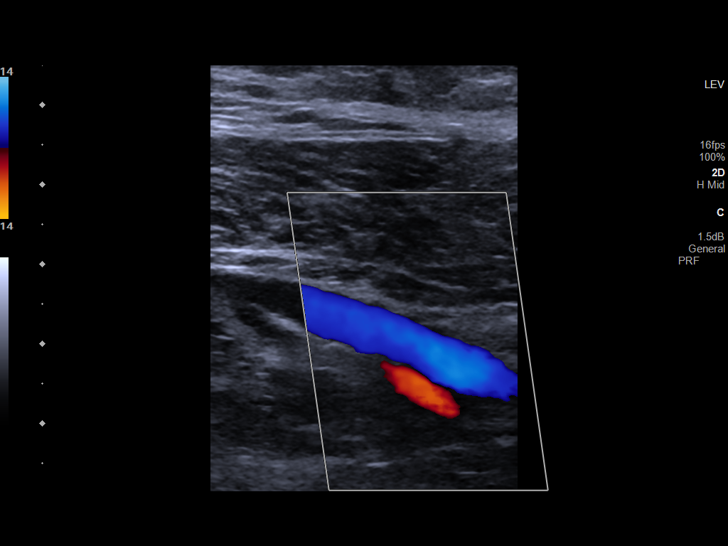
[im 58/58]
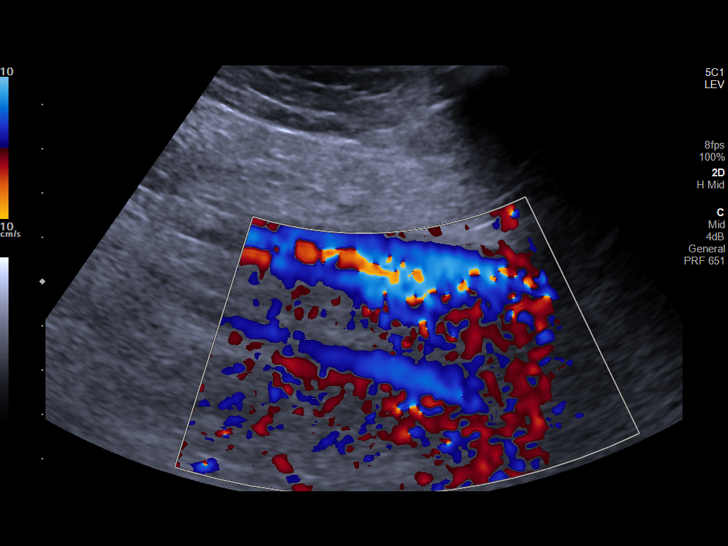

[13 of 24 positions shown; findings below may reference images not displayed]

FINDINGS: RIGHT LOWER EXTREMITY

Common Femoral Vein: No evidence of thrombus. Normal
compressibility, respiratory phasicity and response to augmentation.

Saphenofemoral Junction: No evidence of thrombus. Normal
compressibility and flow on color Doppler imaging.

Profunda Femoral Vein: No evidence of thrombus. Normal
compressibility and flow on color Doppler imaging.

Femoral Vein: No evidence of thrombus. Normal compressibility,
respiratory phasicity and response to augmentation.

Popliteal Vein: No evidence of thrombus. Normal compressibility,
respiratory phasicity and response to augmentation.

Calf Veins: No evidence of thrombus. Normal compressibility and flow
on color Doppler imaging.

Superficial Great Saphenous Vein: No evidence of thrombus. Normal
compressibility.

Venous Reflux:  None.

Other Findings:  None.

LEFT LOWER EXTREMITY

Common Femoral Vein: No evidence of thrombus. Normal
compressibility, respiratory phasicity and response to augmentation.

Saphenofemoral Junction: No evidence of thrombus. Normal
compressibility and flow on color Doppler imaging.

Profunda Femoral Vein: No evidence of thrombus. Normal
compressibility and flow on color Doppler imaging.

Femoral Vein: No evidence of thrombus. Normal compressibility,
respiratory phasicity and response to augmentation.

Popliteal Vein: No evidence of thrombus. Normal compressibility,
respiratory phasicity and response to augmentation.

Calf Veins: No evidence of thrombus. Normal compressibility and flow
on color Doppler imaging.

Superficial Great Saphenous Vein: No evidence of thrombus. Normal
compressibility.

Venous Reflux:  None.

Other Findings:  None.
IMPRESSION: No evidence of deep venous thrombosis in either lower extremity.

## 2022-10-17 ENCOUNTER — Other Ambulatory Visit: Payer: Self-pay

## 2022-10-17 ENCOUNTER — Emergency Department
Admission: EM | Admit: 2022-10-17 | Discharge: 2022-10-17 | Disposition: A | Discharge status: Home / Self Care | Payer: Medicaid Other | Attending: Emergency Medicine | Admitting: Emergency Medicine

## 2022-10-17 ENCOUNTER — Emergency Department: Payer: Medicaid Other

## 2022-10-17 DIAGNOSIS — N50819 Testicular pain, unspecified: Secondary | ICD-10-CM | POA: Diagnosis not present

## 2022-10-17 DIAGNOSIS — M549 Dorsalgia, unspecified: Secondary | ICD-10-CM | POA: Diagnosis not present

## 2022-10-17 DIAGNOSIS — N50812 Left testicular pain: Secondary | ICD-10-CM | POA: Insufficient documentation

## 2022-10-17 DIAGNOSIS — G8929 Other chronic pain: Secondary | ICD-10-CM | POA: Diagnosis not present

## 2022-10-17 DIAGNOSIS — F259 Schizoaffective disorder, unspecified: Secondary | ICD-10-CM | POA: Diagnosis not present

## 2022-10-17 DIAGNOSIS — R339 Retention of urine, unspecified: Secondary | ICD-10-CM | POA: Insufficient documentation

## 2022-10-17 DIAGNOSIS — R782 Finding of cocaine in blood: Secondary | ICD-10-CM | POA: Insufficient documentation

## 2022-10-17 DIAGNOSIS — K5900 Constipation, unspecified: Secondary | ICD-10-CM | POA: Diagnosis not present

## 2022-10-17 DIAGNOSIS — N50811 Right testicular pain: Secondary | ICD-10-CM | POA: Insufficient documentation

## 2022-10-17 LAB — URINALYSIS, ROUTINE W REFLEX MICROSCOPIC
Bacteria, UA: NONE SEEN
Bilirubin Urine: NEGATIVE
Glucose, UA: NEGATIVE mg/dL
Hgb urine dipstick: NEGATIVE
Ketones, ur: NEGATIVE mg/dL
Leukocytes,Ua: NEGATIVE
Nitrite: NEGATIVE
Protein, ur: NEGATIVE mg/dL
Specific Gravity, Urine: 1.02 (ref 1.005–1.030)
Squamous Epithelial / HPF: NONE SEEN /HPF (ref 0–5)
pH: 7 (ref 5.0–8.0)

## 2022-10-17 LAB — BASIC METABOLIC PANEL
Anion gap: 7 (ref 5–15)
BUN: 17 mg/dL (ref 6–20)
CO2: 27 mmol/L (ref 22–32)
Calcium: 9 mg/dL (ref 8.9–10.3)
Chloride: 106 mmol/L (ref 98–111)
Creatinine, Ser: 1.03 mg/dL (ref 0.61–1.24)
GFR, Estimated: >60 mL/min (ref >60)
Glucose, Bld: 100 mg/dL — ABNORMAL HIGH (ref 70–99)
Potassium: 3.7 mmol/L (ref 3.5–5.1)
Sodium: 140 mmol/L (ref 135–145)

## 2022-10-17 LAB — CBC
HCT: 41.9 % (ref 39.0–52.0)
Hemoglobin: 13.8 g/dL (ref 13.0–17.0)
MCH: 28.6 pg (ref 26.0–34.0)
MCHC: 32.9 g/dL (ref 30.0–36.0)
MCV: 86.9 fL (ref 80.0–100.0)
Platelets: 262 10*3/uL (ref 150–400)
RBC: 4.82 MIL/uL (ref 4.22–5.81)
RDW: 13.6 % (ref 11.5–15.5)
WBC: 7.4 10*3/uL (ref 4.0–10.5)
nRBC: 0 % (ref 0.0–0.2)

## 2022-10-17 LAB — URINE DRUG SCREEN, QUALITATIVE (ARMC ONLY)
Amphetamines, Ur Screen: NOT DETECTED
Barbiturates, Ur Screen: NOT DETECTED
Benzodiazepine, Ur Scrn: NOT DETECTED
Cannabinoid 50 Ng, Ur ~~LOC~~: NOT DETECTED
Cocaine Metabolite,Ur ~~LOC~~: POSITIVE — AB
MDMA (Ecstasy)Ur Screen: NOT DETECTED
Methadone Scn, Ur: NOT DETECTED
Opiate, Ur Screen: NOT DETECTED
Phencyclidine (PCP) Ur S: NOT DETECTED
Tricyclic, Ur Screen: NOT DETECTED

## 2022-10-17 LAB — CK: Total CK: 105 U/L (ref 49–397)

## 2022-10-17 MED ORDER — MINERAL OIL RE ENEM: 1.0000 | ENEMA | Freq: Once | RECTAL | Status: DC

## 2022-10-17 MED ORDER — POLYETHYLENE GLYCOL 3350 17 G PO PACK: 17.0000 g | PACK | Freq: Every day | ORAL | 0 refills | Status: AC

## 2022-10-17 NOTE — ED Provider Notes (Signed)
[facility] Provider Note    Event Date/Time   First MD Initiated Contact with Patient 10/17/22 [phone]     (approximate)   History   Constipation   HPI  Nicholas Barron is a 27 y.o. male past medical history of schizophrenia, substance use who presents because of inability urinate constipation and testicle pain.  Patient is very vague about onset of his symptoms but states been going on for several weeks.  He endorses pain in bilateral testicles as well as inability to urinate cannot tell me when the last time he went.  Says he was not able to have a bowel movement this morning, he is not sure when his last bowel movement was.  He has chronic back pain this is unchanged today.  Denies any numbness tingling weakness in his legs.  He still able to ambulate.  Does not Dors abdominal pain.    Past Medical History:  Diagnosis Date   Schizophrenia [facility])     Patient Active Problem List   Diagnosis Date Noted   Schizophrenia, undifferentiated (HCC) 03/30/2020   Schizophrenia (HCC)    Noncompliance 12/06/2018   Cannabis abuse 12/06/2018   Schizoaffective disorder (HCC) 12/05/2018     Physical Exam  Triage Vital Signs: ED Triage Vitals  Enc Vitals Group     BP 10/17/22 0906 (!) 132/91     Pulse Rate 10/17/22 0906 73     Resp 10/17/22 0906 18     Temp 10/17/22 0906 98.9 F (37.2 C)     Temp src --      SpO2 10/17/22 0906 100 %     Weight 10/17/22 0907 180 lb (81.6 kg)     Height 10/17/22 0907 5\' 9"  (1.753 m)     Head Circumference --      Peak Flow --      Pain Score 10/17/22 0907 0     Pain Loc --      Pain Edu? --      Excl. in GC? --     Most recent vital signs: Vitals:   10/17/22 0906  BP: (!) 132/91  Pulse: 73  Resp: 18  Temp: 98.9 F (37.2 C)  SpO2: 100%     General: Awake, patient is disheveled CV:  Good peripheral perfusion.  Resp:  Normal effort.  Abd:  No distention.  Abdomen is soft and nontender throughout Neuro:              Awake, Alert, Oriented x 3  Other:  Normal external GU exam testicles nontender Patient has stool in the rectal vault and on the anus which is soft   ED Results / Procedures / Treatments  Labs (all labs ordered are listed, but only abnormal results are displayed) Labs Reviewed  URINALYSIS, ROUTINE W REFLEX MICROSCOPIC - Abnormal; Notable for the following components:      Result Value   APPearance CLEAR (*)    All other components within normal limits  BASIC METABOLIC PANEL - Abnormal; Notable for the following components:   Glucose, Bld 100 (*)    All other components within normal limits  CBC  CK  URINE DRUG SCREEN, QUALITATIVE ([facility] ONLY)     EKG     RADIOLOGY I reviewed and interpreted the CT scan of the brain which does not show any acute intracranial process    PROCEDURES:  Critical Care performed: No  Procedures   MEDICATIONS ORDERED IN ED: Medications  mineral oil enema 1 enema (  1 enema Rectal Not Given 10/17/22 1039)     IMPRESSION / MDM / ASSESSMENT AND PLAN / ED COURSE  I reviewed the triage vital signs and the nursing notes.                              Patient's presentation is most consistent with acute complicated illness / injury requiring diagnostic workup.  Differential diagnosis includes, but is not limited to, constipation, fecal impaction, anal fissure, urinary retention  Patient is a 27 year old male history of homelessness and schizophrenia who presents today because of urinary retention and constipation.  Patient is quite vague with his history not able to tell me exactly when this started.  Does endorse back pain but this is more of a chronic issue denies any numbness or weakness in his legs.  Does endorse abdominal pain as well.  Patient's vital signs are reassuring.  Patient is disheveled appearing on exam.  Abdominal exam is benign.  Normal external GU exam no testicular tenderness or swelling.  Patient does have stool externally  coming from the anus that is soft that I attempted to manually remove but he would not allow me to.  He declined disimpaction.  Plan to trial enema and obtain bladder scan and urinalysis and UDS.  I have low suspicion for acute intra-abdominal process given benign abdomen.  Did consider diagnosis of spinal cord pathology given the constipation and reported urinary retnetion however he has normal strength and sensation in his extremities and is ambulatory so feel this is less likely  Patient was initially amenable to enema however he later refused.  On bladder scan he has about 400 cc of urine in his bladder.  Discussed possible Foley placement versus trial of void he did not want to have a Foley catheter placed.  However does not seem to fully understand what urinary retention or Foley catheter means and has a lot of difficulty paying attention when I am saying.  Patient later tells me that he fell and given his altered mental status I thus obtained a CT head as well as a CK both of which are reassuring.  On reassessment patient has now urinated.  Urinalysis is negative for infection and UDS is still pending but will not change management.  Patient's mental status seems to have improved he asked to have his phone charged and called his mom says [place] ride [place] on the highway.  Discussed again with him his severe constipation and recommended starting MiraLAX for possible use of enema at home.  At this point do not feel that there is any indication for ongoing workup or admission.  Will discharge. FINAL CLINICAL IMPRESSION(S) / ED DIAGNOSES   Final diagnoses:  Constipation, unspecified constipation type     Rx / DC Orders   ED Discharge Orders          Ordered    polyethylene glycol (MIRALAX) 17 g packet  Daily        10/17/22 1458             Note:  This document was prepared using Dragon voice recognition software and may include unintentional dictation errors.   Georga Hacking,  MD 10/17/22 [phone]

## 2022-10-17 NOTE — Discharge Instructions (Addendum)
Please start taking MiraLAX for constipation.  If you are unable to urinate again or develop abdominal pain then please return to emergency department.

## 2022-10-17 NOTE — ED Notes (Signed)
Pt refuses to attempt urine sample.

## 2022-10-17 NOTE — ED Notes (Signed)
Called lab to add on CK 

## 2022-10-17 NOTE — ED Triage Notes (Signed)
Pt to ED [facility] from laying outside, pt is homeless. Pt c/o not being able to urinate or have BM. States last urination last night. Pt with poor attention span, has to be asked questions repeatedly. Reports ETOH, marijuana and cocaine use.

## 2023-03-28 NOTE — Congregational Nurse Program (Unsigned)
  Dept: [phone]   Congregational Nurse Program Note  Date of Encounter: 03/28/2023 Client to [facility] day center nurse led clinic with request for assistance in calling [facility]. Assistance given. Client reports he was seen at [facility] in the past. Phone call placed, client was discharged from [facility] last year and will need a new assessment. Client was unable to recall the medications he had been on in the past. [place] [place] bus passes given for transportation assistance to [facility] on 10/23 for a new assessment. Francesco Runner BSN, RN Past Medical History: Past Medical History:  Diagnosis Date   Schizophrenia [facility])     Encounter Details:  Community Questionnaire - 03/28/23 1210       Questionnaire   Ask client: Do you give verbal consent for me to treat you today? Yes    Student Assistance N/A    Location Patient Served  [facility]    Encounter Setting CN site    Population Status Unhoused    Insurance Medicaid    Insurance/Financial Assistance Referral N/A    Medication Have Medication Insecurities    Medical Provider No    Screening Referrals Made N/A    Medical Referrals Made N/A    Medical Appointment Completed N/A    CNP Interventions Navigate Healthcare System;Advocate/Support    Screenings CN Performed N/A    ED Visit Averted N/A    Life-Saving Intervention Made N/A
